# Patient Record
Sex: Female | Born: 1960
Health system: Southern US, Community
[De-identification: ages and names within clinical notes are randomized; demographics above are authoritative.]

## PROBLEM LIST (undated history)

## (undated) DIAGNOSIS — F32A Depression, unspecified: Secondary | ICD-10-CM

## (undated) DIAGNOSIS — J45909 Unspecified asthma, uncomplicated: Secondary | ICD-10-CM

## (undated) DIAGNOSIS — F419 Anxiety disorder, unspecified: Secondary | ICD-10-CM

## (undated) DIAGNOSIS — E039 Hypothyroidism, unspecified: Secondary | ICD-10-CM

## (undated) DIAGNOSIS — E785 Hyperlipidemia, unspecified: Secondary | ICD-10-CM

## (undated) DIAGNOSIS — G43909 Migraine, unspecified, not intractable, without status migrainosus: Secondary | ICD-10-CM

## (undated) DIAGNOSIS — F329 Major depressive disorder, single episode, unspecified: Secondary | ICD-10-CM

## (undated) DIAGNOSIS — G5600 Carpal tunnel syndrome, unspecified upper limb: Secondary | ICD-10-CM

## (undated) HISTORY — PX: REPLACEMENT TOTAL KNEE: SUR1224

## (undated) HISTORY — DX: Carpal tunnel syndrome, unspecified upper limb: G56.00

## (undated) HISTORY — PX: KNEE ARTHROPLASTY: SHX992

## (undated) HISTORY — DX: Migraine, unspecified, not intractable, without status migrainosus: G43.909

## (undated) HISTORY — DX: Hypothyroidism, unspecified: E03.9

## (undated) HISTORY — DX: Major depressive disorder, single episode, unspecified: F32.9

## (undated) HISTORY — DX: Unspecified asthma, uncomplicated: J45.909

## (undated) HISTORY — DX: Anxiety disorder, unspecified: F41.9

## (undated) HISTORY — DX: Depression, unspecified: F32.A

## (undated) HISTORY — DX: Hyperlipidemia, unspecified: E78.5

---

## 1991-09-03 HISTORY — PX: TONSILECTOMY/ADENOIDECTOMY WITH MYRINGOTOMY: SHX6125

## 1992-09-05 HISTORY — PX: SPINAL CORD DECOMPRESSION: SHX97

## 1999-02-27 ENCOUNTER — Other Ambulatory Visit: Admission: RE | Admit: 1999-02-27 | Discharge: 1999-02-27 | Payer: Self-pay | Admitting: *Deleted

## 1999-03-16 ENCOUNTER — Ambulatory Visit (HOSPITAL_COMMUNITY): Admission: RE | Admit: 1999-03-16 | Discharge: 1999-03-16 | Payer: Self-pay | Admitting: *Deleted

## 1999-03-16 ENCOUNTER — Encounter: Payer: Self-pay | Admitting: *Deleted

## 2000-04-16 ENCOUNTER — Other Ambulatory Visit: Admission: RE | Admit: 2000-04-16 | Discharge: 2000-04-16 | Payer: Self-pay | Admitting: *Deleted

## 2000-12-05 ENCOUNTER — Encounter: Admission: RE | Admit: 2000-12-05 | Discharge: 2000-12-05 | Payer: Self-pay | Admitting: Otolaryngology

## 2000-12-05 ENCOUNTER — Encounter: Payer: Self-pay | Admitting: Otolaryngology

## 2001-07-27 ENCOUNTER — Encounter: Payer: Self-pay | Admitting: Family Medicine

## 2001-07-27 ENCOUNTER — Ambulatory Visit (HOSPITAL_COMMUNITY): Admission: RE | Admit: 2001-07-27 | Discharge: 2001-07-27 | Payer: Self-pay | Admitting: Family Medicine

## 2001-10-23 ENCOUNTER — Other Ambulatory Visit: Admission: RE | Admit: 2001-10-23 | Discharge: 2001-10-23 | Payer: Self-pay | Admitting: *Deleted

## 2002-11-04 ENCOUNTER — Other Ambulatory Visit: Admission: RE | Admit: 2002-11-04 | Discharge: 2002-11-04 | Payer: Self-pay | Admitting: Obstetrics & Gynecology

## 2003-06-03 ENCOUNTER — Ambulatory Visit (HOSPITAL_COMMUNITY): Admission: RE | Admit: 2003-06-03 | Discharge: 2003-06-03 | Payer: Self-pay | Admitting: Obstetrics & Gynecology

## 2003-06-03 ENCOUNTER — Encounter: Payer: Self-pay | Admitting: Obstetrics & Gynecology

## 2004-06-21 ENCOUNTER — Ambulatory Visit (HOSPITAL_COMMUNITY): Admission: RE | Admit: 2004-06-21 | Discharge: 2004-06-21 | Payer: Self-pay | Admitting: Obstetrics & Gynecology

## 2006-07-02 ENCOUNTER — Ambulatory Visit (HOSPITAL_COMMUNITY): Admission: RE | Admit: 2006-07-02 | Discharge: 2006-07-02 | Payer: Self-pay | Admitting: Obstetrics & Gynecology

## 2007-07-10 ENCOUNTER — Ambulatory Visit (HOSPITAL_COMMUNITY): Admission: RE | Admit: 2007-07-10 | Discharge: 2007-07-10 | Payer: Self-pay | Admitting: Obstetrics & Gynecology

## 2008-07-15 ENCOUNTER — Ambulatory Visit (HOSPITAL_COMMUNITY): Admission: RE | Admit: 2008-07-15 | Discharge: 2008-07-15 | Payer: Self-pay | Admitting: Obstetrics & Gynecology

## 2010-07-12 ENCOUNTER — Ambulatory Visit (HOSPITAL_COMMUNITY): Admission: RE | Admit: 2010-07-12 | Discharge: 2010-07-12 | Payer: Self-pay | Admitting: Obstetrics & Gynecology

## 2013-04-12 ENCOUNTER — Other Ambulatory Visit (HOSPITAL_COMMUNITY): Payer: Self-pay | Admitting: Obstetrics & Gynecology

## 2013-04-12 DIAGNOSIS — Z1239 Encounter for other screening for malignant neoplasm of breast: Secondary | ICD-10-CM

## 2013-04-16 ENCOUNTER — Ambulatory Visit (HOSPITAL_COMMUNITY): Payer: BC Managed Care – PPO

## 2013-04-21 ENCOUNTER — Ambulatory Visit (HOSPITAL_COMMUNITY)
Admission: RE | Admit: 2013-04-21 | Discharge: 2013-04-21 | Disposition: A | Discharge status: Home / Self Care | Payer: BC Managed Care – PPO | Source: Ambulatory Visit | Attending: Obstetrics & Gynecology | Admitting: Obstetrics & Gynecology

## 2013-04-21 DIAGNOSIS — Z1239 Encounter for other screening for malignant neoplasm of breast: Secondary | ICD-10-CM

## 2013-04-21 DIAGNOSIS — Z1231 Encounter for screening mammogram for malignant neoplasm of breast: Secondary | ICD-10-CM | POA: Insufficient documentation

## 2013-04-27 ENCOUNTER — Other Ambulatory Visit: Payer: Self-pay | Admitting: Obstetrics & Gynecology

## 2013-04-27 DIAGNOSIS — R928 Other abnormal and inconclusive findings on diagnostic imaging of breast: Secondary | ICD-10-CM

## 2013-05-05 ENCOUNTER — Other Ambulatory Visit: Payer: Self-pay | Admitting: Family Medicine

## 2013-05-05 ENCOUNTER — Ambulatory Visit
Admission: RE | Admit: 2013-05-05 | Discharge: 2013-05-05 | Disposition: A | Discharge status: Home / Self Care | Payer: BC Managed Care – PPO | Source: Ambulatory Visit | Attending: Obstetrics & Gynecology | Admitting: Obstetrics & Gynecology

## 2013-05-05 DIAGNOSIS — R928 Other abnormal and inconclusive findings on diagnostic imaging of breast: Secondary | ICD-10-CM

## 2013-05-12 ENCOUNTER — Other Ambulatory Visit: Payer: BC Managed Care – PPO

## 2013-10-01 ENCOUNTER — Other Ambulatory Visit: Payer: Self-pay | Admitting: Family Medicine

## 2013-10-01 DIAGNOSIS — N63 Unspecified lump in unspecified breast: Secondary | ICD-10-CM

## 2013-11-03 ENCOUNTER — Ambulatory Visit
Admission: RE | Admit: 2013-11-03 | Discharge: 2013-11-03 | Disposition: A | Discharge status: Home / Self Care | Payer: Private Health Insurance - Indemnity | Source: Ambulatory Visit | Attending: Family Medicine | Admitting: Family Medicine

## 2013-11-03 DIAGNOSIS — N63 Unspecified lump in unspecified breast: Secondary | ICD-10-CM

## 2014-04-21 ENCOUNTER — Encounter: Payer: Self-pay | Admitting: Endocrinology

## 2014-04-21 ENCOUNTER — Ambulatory Visit (INDEPENDENT_AMBULATORY_CARE_PROVIDER_SITE_OTHER): Payer: Managed Care, Other (non HMO) | Admitting: Endocrinology

## 2014-04-21 VITALS — BP 140/86 | HR 76 | Temp 97.3°F | Resp 16 | Ht 63.0 in | Wt 267.0 lb

## 2014-04-21 DIAGNOSIS — R5381 Other malaise: Secondary | ICD-10-CM | POA: Insufficient documentation

## 2014-04-21 DIAGNOSIS — E038 Other specified hypothyroidism: Secondary | ICD-10-CM | POA: Insufficient documentation

## 2014-04-21 DIAGNOSIS — E041 Nontoxic single thyroid nodule: Secondary | ICD-10-CM | POA: Insufficient documentation

## 2014-04-21 DIAGNOSIS — N911 Secondary amenorrhea: Secondary | ICD-10-CM

## 2014-04-21 DIAGNOSIS — N912 Amenorrhea, unspecified: Secondary | ICD-10-CM

## 2014-04-21 DIAGNOSIS — R5383 Other fatigue: Secondary | ICD-10-CM

## 2014-04-21 LAB — CORTISOL: CORTISOL PLASMA: 8.5 ug/dL

## 2014-04-21 LAB — FOLLICLE STIMULATING HORMONE: FSH: 69.6 m[IU]/mL

## 2014-04-21 NOTE — Progress Notes (Signed)
Patient ID: Joyce Watts, female   DOB: 09/28/1960, 53 y.o.   MRN: [id number]   Reason for Appointment:  ? Hypothyroidism, new visit    History of Present Illness:   Over the last 3 months or so the patient is complaining about increasing fatigue. Although she wakes up feeling tired in the morning she tends to stay tired during the day; she is able to do her activities although she feels like she could use a nap in the afternoon. Does not have any weakness in her limbs Occasionally may have difficulty concentrating and has recent  difficulties with memory also She has been losing some hair over the last year mostly on the sides. She thinks her nails are somewhat more fragile and thin, no dry skin or constipation She used to be cold sensitive and now is getting regarding hot sensations Recently has not gained any weight but over the last couple of years has gained 20 pounds  She was apparently evaluated in 2010 by an endocrinologist for similar problems but no details are available She was not given any treatment at that time  Recently had labs checked by her PCP and was found to have abnormal results as follows  On 03/30/14 TSH was 0.21 and free T4 was 0.59          Past Medical History  Diagnosis Date  . Depression   . Anxiety   . Migraines     Past history  . Carpal tunnel syndrome   . Hyperlipidemia     LDL in 6/15 = 133    No past surgical history on file.  Family History  Problem Relation Age of Onset  . Hypertension Father   . Diabetes Father   . Diabetes Maternal Grandfather   . Diabetes Paternal Grandfather   . Thyroid disease Neg Hx     Social History:  reports that she has never smoked. She has never used smokeless tobacco. Her alcohol and drug histories are not on file.  Allergies:  Allergies  Allergen Reactions  . Sulfa Antibiotics Rash      Medication List       This list is accurate as of: 04/21/14  5:14 PM.  Always use your most recent med list.                ALPRAZolam 0.5 MG tablet  Commonly known as:  XANAX  as needed.     buPROPion 300 MG 24 hr tablet  Commonly known as:  WELLBUTRIN XL     hydrochlorothiazide 25 MG tablet  Commonly known as:  HYDRODIURIL  as needed.     mupirocin cream 2 %  Commonly known as:  BACTROBAN     VENTOLIN HFA 108 (90 BASE) MCG/ACT inhaler  Generic drug:  albuterol  as needed.        Review of Systems:  She is postmenopausal. Has not had any menstrual cycles for about 2 years. Initially had premenopausal hot flashes. For the last 6 months has had periodic sweating.  CARDIOLOGY: no history of high blood pressure.   Edema present in the past, treated with taking HCTZ off and on          GASTROENTEROLOGY:  no Change in bowel habits, no abdominal pain     ENDOCRINOLOGY:  no history of Diabetes.           Has had anxiety for 1.5-2 years and also some depression treated with Xanax and Wellbutrin  Still complains about  carpal tunnel syndrome in her hand   Examination:    BP 140/86  Pulse 76  Temp(Src) 97.3 F (36.3 C)  Resp 16  Ht 5\' 3"  (1.6 m)  Wt 267 lb (121.11 kg)  BMI 47.31 kg/m2  SpO2 98%   General Appearance: pleasant, has generalized obesity, no cushingoid features         Eyes: No prominence or swelling of eyes. Fundi showed normal optic discs. Visual fields normal by confrontation           Neck: The thyroid is palpable on the left side, has a smooth firm 2-3 cm nodule felt on swallowing, best felt in the supine position. Right lobe not palpable There is no lymphadenopathy in the neck.   ENT: Tongue normal, no oral pigmentation Cardiovascular: Normal  heart sounds, no murmur Respiratory:  Lungs clear Gastrointestinal: abdomen soft, no hepatosplenomegaly present Neurological: REFLEXES: at biceps are normal to brisk, ankle reflexes difficult to elicit.     Skin: No excessive dryness, no skin rash or pigmentation Extremities: No edema present. Joints and nails appear  normal  Assessments/Plan   1. Probable Secondary Hypothyroidism with low free T4 level and symptoms of fatigue, weight gain, memory difficulties and some hair loss She does have other confounding issues that can cause fatigue including her sleep disturbance and anxiety/depression However her low normal free T4 and TSH indicate likely secondary hypothyroidism For this she will need to have evaluation of the pituitary hormones and determine if this is an isolated problem or more generalized which would indicate a structural issue with the pituitary gland  If her pituitary is otherwise normal will have her empirically try thyroid supplement starting with 50 mcg levothyroxine. She will followup in 2 months  2. Left thyroid nodule. This will need further evaluation since this is a new finding.  Her low TSH may be a reflection of an autonomous thyroid which may need to be evaluated with a thyroid scan. To start with we will get her evaluated with a thyroid ultrasound Discussed that 95% of thyroid nodules are benign but this may need tissue diagnosis   Joyce Watts 04/21/2014, 5:14 PM

## 2014-04-22 LAB — PROLACTIN: PROLACTIN: 9 ng/mL

## 2014-04-22 LAB — INSULIN-LIKE GROWTH FACTOR: Somatomedin (IGF-I): 119 ng/mL (ref 49–220)

## 2014-04-23 NOTE — Progress Notes (Signed)
Quick Note:  Please let patient know that the lab results are all normal and start levothyroxine 50 mcg daily for thyroid, followup as directed ______

## 2014-04-26 ENCOUNTER — Other Ambulatory Visit: Payer: Self-pay | Admitting: *Deleted

## 2014-04-26 ENCOUNTER — Ambulatory Visit
Admission: RE | Admit: 2014-04-26 | Discharge: 2014-04-26 | Disposition: A | Discharge status: Home / Self Care | Payer: Private Health Insurance - Indemnity | Source: Ambulatory Visit | Attending: Endocrinology | Admitting: Endocrinology

## 2014-04-26 DIAGNOSIS — E041 Nontoxic single thyroid nodule: Secondary | ICD-10-CM

## 2014-04-26 MED ORDER — LEVOTHYROXINE SODIUM 50 MCG PO TABS: 50.0000 ug | ORAL_TABLET | Freq: Every day | ORAL | Status: DC

## 2014-04-28 ENCOUNTER — Other Ambulatory Visit: Payer: Self-pay | Admitting: Endocrinology

## 2014-04-28 DIAGNOSIS — E041 Nontoxic single thyroid nodule: Secondary | ICD-10-CM

## 2014-05-10 ENCOUNTER — Ambulatory Visit
Admission: RE | Admit: 2014-05-10 | Discharge: 2014-05-10 | Disposition: A | Discharge status: Home / Self Care | Payer: Private Health Insurance - Indemnity | Source: Ambulatory Visit | Attending: Endocrinology | Admitting: Endocrinology

## 2014-05-10 ENCOUNTER — Other Ambulatory Visit (HOSPITAL_COMMUNITY)
Admission: RE | Admit: 2014-05-10 | Discharge: 2014-05-10 | Disposition: A | Discharge status: Home / Self Care | Payer: Managed Care, Other (non HMO) | Source: Ambulatory Visit | Attending: Interventional Radiology | Admitting: Interventional Radiology

## 2014-05-10 DIAGNOSIS — E041 Nontoxic single thyroid nodule: Secondary | ICD-10-CM | POA: Insufficient documentation

## 2014-05-12 ENCOUNTER — Telehealth: Payer: Self-pay

## 2014-05-12 NOTE — Telephone Encounter (Signed)
Pt called concerning her thyroid Bx result.   Please advise pt, Thanks!

## 2014-06-13 ENCOUNTER — Other Ambulatory Visit: Payer: Self-pay | Admitting: *Deleted

## 2014-06-13 ENCOUNTER — Other Ambulatory Visit (INDEPENDENT_AMBULATORY_CARE_PROVIDER_SITE_OTHER): Payer: Managed Care, Other (non HMO)

## 2014-06-13 ENCOUNTER — Other Ambulatory Visit: Payer: Managed Care, Other (non HMO)

## 2014-06-13 DIAGNOSIS — E038 Other specified hypothyroidism: Secondary | ICD-10-CM

## 2014-06-13 LAB — TSH: TSH: 0.04 u[IU]/mL — ABNORMAL LOW (ref 0.35–4.50)

## 2014-06-13 LAB — T4, FREE: Free T4: 1 ng/dL (ref 0.60–1.60)

## 2014-06-14 ENCOUNTER — Other Ambulatory Visit: Payer: Self-pay | Admitting: Family Medicine

## 2014-06-14 DIAGNOSIS — N63 Unspecified lump in unspecified breast: Secondary | ICD-10-CM

## 2014-06-16 ENCOUNTER — Ambulatory Visit (INDEPENDENT_AMBULATORY_CARE_PROVIDER_SITE_OTHER): Payer: Private Health Insurance - Indemnity | Admitting: Endocrinology

## 2014-06-16 ENCOUNTER — Encounter: Payer: Self-pay | Admitting: Endocrinology

## 2014-06-16 VITALS — BP 118/82 | HR 100 | Temp 98.2°F | Resp 16 | Ht 63.0 in | Wt 264.2 lb

## 2014-06-16 DIAGNOSIS — E041 Nontoxic single thyroid nodule: Secondary | ICD-10-CM

## 2014-06-16 DIAGNOSIS — E038 Other specified hypothyroidism: Secondary | ICD-10-CM

## 2014-06-16 DIAGNOSIS — Z23 Encounter for immunization: Secondary | ICD-10-CM

## 2014-06-16 NOTE — Patient Instructions (Signed)
Same dose 

## 2014-06-16 NOTE — Progress Notes (Signed)
Patient ID: Joyce Watts, female   DOB: 07/17/1961, 53 y.o.   MRN: [id number]   Reason for Appointment:  ? Hypothyroidism, new visit    History of Present Illness:   She was seen in evaluation for secondary hypothyroidism in 04/2014 At that time she was having symptoms of fatigue, weight gain, memory difficulties and some hair loss; symptoms had started in 5/15 Also had some nail changes Occasionally has had difficulty concentrating and has some  difficulties with memory  She used to be cold sensitive and now is getting regarding hot sensations  She was found to have a low free T4 of 0.59 by her PCP and referred here for further management. TSH has been normal  She was evaluated for pituitary hypofunction and all the labs were perfectly normal including FSH She was started on levothyroxine 50 mcg daily Since then patient has started to improve with her energy level and also thinks that her fingernail and hair changes are getting better Does not complain of much difficulty with concentration or memory Also her weight has improved 3 pounds Her labs are back to normal with free T4  Lab Results  Component Value Date   FREET4 1.00 06/13/2014   TSH 0.04* 06/13/2014      Wt Readings from Last 3 Encounters:  06/16/14 264 lb 3.2 oz (119.84 kg)  04/21/14 267 lb (121.11 kg)    PROBLEM 2: Left thyroid nodule.   On her exam she was found to have a thyroid nodule on the left side and an ultrasound this was 4.8 cm She does occasionally have difficulty swallowing liquids for several months but this is only minor problem. No choking Thyroid biopsy showed benign follicular cells    Past Medical History  Diagnosis Date  . Depression   . Anxiety   . Migraines     Past history  . Carpal tunnel syndrome   . Hyperlipidemia     LDL in 6/15 = 133    No past surgical history on file.  Family History  Problem Relation Age of Onset  . Hypertension Father   . Diabetes Father   .  Diabetes Maternal Grandfather   . Diabetes Paternal Grandfather   . Thyroid disease Neg Hx     Social History:  reports that she has never smoked. She has never used smokeless tobacco. Her alcohol and drug histories are not on file.  Allergies:  Allergies  Allergen Reactions  . Sulfa Antibiotics Rash      Medication List       This list is accurate as of: 06/16/14  4:59 PM.  Always use your most recent med list.               ALPRAZolam 0.5 MG tablet  Commonly known as:  XANAX  as needed.     buPROPion 300 MG 24 hr tablet  Commonly known as:  WELLBUTRIN XL     hydrochlorothiazide 25 MG tablet  Commonly known as:  HYDRODIURIL  as needed.     levothyroxine 50 MCG tablet  Commonly known as:  SYNTHROID, LEVOTHROID  Take 1 tablet (50 mcg total) by mouth daily.     mupirocin cream 2 %  Commonly known as:  BACTROBAN     VENTOLIN HFA 108 (90 BASE) MCG/ACT inhaler  Generic drug:  albuterol  as needed.        Review of Systems:  She is postmenopausal. Has not had any menstrual cycles for about 2 years. Initially  had premenopausal hot flashes.  For the last 6 months has had periodic sweating.   Edema present in the past, treated with taking HCTZ off and on          ENDOCRINOLOGY:  no history of Diabetes.           Has had anxiety for 1.5-2 years and also some depression treated with Xanax and Wellbutrin  Has carpal tunnel syndrome in her hand   Examination:    BP 118/82  Pulse 100  Temp(Src) 98.2 F (36.8 C)  Resp 16  Ht 5\' 3"  (1.6 m)  Wt 264 lb 3.2 oz (119.84 kg)  BMI 46.81 kg/m2  SpO2 95%  Thyroid not palpable.  Biceps reflexes appear normal  Assessments/Plan   1.  Secondary Hypothyroidism with low free T4 level and symptoms of fatigue, weight gain, memory difficulties and  hair loss She is subjectively starting to feel better with levothyroxine supplementation and her free T4 is good at 1.0 No evidence of pituitary hypofunction otherwise She  will continue same dose for now   2. Left thyroid nodule. This is benign on biopsy and will follow clinically Her TSH is relatively low probably reflecting pituitary hypofunction and effect of thyroid supplementation recently   [facility],Joyce Mcgriff 06/16/2014, 4:59 PM

## 2014-06-21 ENCOUNTER — Ambulatory Visit
Admission: RE | Admit: 2014-06-21 | Discharge: 2014-06-21 | Disposition: A | Discharge status: Home / Self Care | Payer: Private Health Insurance - Indemnity | Source: Ambulatory Visit | Attending: Family Medicine | Admitting: Family Medicine

## 2014-06-21 ENCOUNTER — Encounter (INDEPENDENT_AMBULATORY_CARE_PROVIDER_SITE_OTHER): Payer: Self-pay

## 2014-06-21 DIAGNOSIS — N63 Unspecified lump in unspecified breast: Secondary | ICD-10-CM

## 2014-10-19 ENCOUNTER — Ambulatory Visit: Payer: Managed Care, Other (non HMO) | Admitting: Endocrinology

## 2014-10-26 ENCOUNTER — Other Ambulatory Visit: Payer: Self-pay | Admitting: Endocrinology

## 2014-11-07 ENCOUNTER — Encounter: Payer: Self-pay | Admitting: Endocrinology

## 2014-11-07 ENCOUNTER — Ambulatory Visit (INDEPENDENT_AMBULATORY_CARE_PROVIDER_SITE_OTHER): Payer: BLUE CROSS/BLUE SHIELD | Admitting: Endocrinology

## 2014-11-07 VITALS — BP 143/93 | HR 83 | Temp 97.6°F | Resp 14 | Ht 63.0 in | Wt 271.0 lb

## 2014-11-07 DIAGNOSIS — E038 Other specified hypothyroidism: Secondary | ICD-10-CM | POA: Diagnosis not present

## 2014-11-07 DIAGNOSIS — E041 Nontoxic single thyroid nodule: Secondary | ICD-10-CM

## 2014-11-07 NOTE — Progress Notes (Signed)
Patient ID: Joyce BurnLora A Blew, female   DOB: 09/14/1960, 54 y.o.   MRN: [id number]   Reason for Appointment:  ? Hypothyroidism, new visit    History of Present Illness:   She was seen in evaluation for secondary hypothyroidism in 04/2014 At that time she was having symptoms of fatigue, weight gain, memory difficulties and some hair loss; symptoms had started in 5/15 Also had some nail changes She was found to have a low free T4 of 0.59 by her PCP and referred here for further management. TSH previously was normal She was evaluated for pituitary hypofunction and all the labs were normal including appropriate level of  FSH  She was started on levothyroxine 50 mcg daily Since then patient had improved her energy level as well as less difficulties with concentration and memory. She thinks she is still having some fingernail and hair changes  He does not have cord intolerance, tends to get hot flashes because of her menopause  Her dose was continued at 50 g in 10/15 and now she is here for follow-up.  No recent labs available   Lab Results  Component Value Date   FREET4 1.00 06/13/2014   TSH 0.04* 06/13/2014      Wt Readings from Last 3 Encounters:  11/07/14 271 lb (122.925 kg)  06/16/14 264 lb 3.2 oz (119.84 kg)  04/21/14 267 lb (121.11 kg)    PROBLEM 2:  Left thyroid nodule.   On her  initial exam she was found to have a thyroid nodule on the left side and an ultrasound this was 4.8 cm She does occasionally have difficulty swallowing liquids for several months but this is not any worse. No choking Thyroid biopsy showed benign follicular cells    Past Medical History  Diagnosis Date  . Depression   . Anxiety   . Migraines     Past history  . Carpal tunnel syndrome   . Hyperlipidemia     LDL in 6/15 = 133    No past surgical history on file.  Family History  Problem Relation Age of Onset  . Hypertension Father   . Diabetes Father   . Diabetes Maternal Grandfather    . Diabetes Paternal Grandfather   . Thyroid disease Neg Hx     Social History:  reports that she has never smoked. She has never used smokeless tobacco. Her alcohol and drug histories are not on file.  Allergies:  Allergies  Allergen Reactions  . Sulfa Antibiotics Rash      Medication List       This list is accurate as of: 11/07/14  4:02 PM.  Always use your most recent med list.               ALPRAZolam 0.5 MG tablet  Commonly known as:  XANAX  as needed.     buPROPion 300 MG 24 hr tablet  Commonly known as:  WELLBUTRIN XL     hydrochlorothiazide 25 MG tablet  Commonly known as:  HYDRODIURIL  as needed.     levothyroxine 50 MCG tablet  Commonly known as:  SYNTHROID, LEVOTHROID  TAKE 1 TABLET BY MOUTH ONCE DAILY     mupirocin cream 2 %  Commonly known as:  BACTROBAN     VENTOLIN HFA 108 (90 BASE) MCG/ACT inhaler  Generic drug:  albuterol  as needed.        Review of Systems:  She is postmenopausal. Has not had any menstrual cycles for about 2 years.  She still has hot flashes  along with periodic sweating. Her gynecologist was not keen on giving her HRT   Edema present in the past, treated with taking HCTZ off and on                  Examination:    BP 143/93 mmHg  Pulse 83  Temp(Src) 97.6 F (36.4 C)  Resp 14  Ht  (1.6 m)  Wt 271 lb (122.925 kg)  BMI 48.02 kg/m2  SpO2 96%  Thyroid exam shows firm rounded nodule felt in the left medial lower lobe with difficulty.  It is estimated to be approximately 3 cm in size  Biceps reflexes appear normal  Assessments/Plan   1.  Secondary Hypothyroidism with low free T4 level and symptoms of fatigue, weight gain, memory difficulties and  hair loss She is subjectively starting to feel better with levothyroxine supplementation and her free T4 is good at 1.0 No evidence of pituitary hypofunction otherwise She will continue same dose for now   2. Left thyroid nodule. This is benign on biopsy and  will follow, will recheck ultrasound on the next visit to reassess the size  Her TSH is relatively low probably reflecting pituitary hypofunction and effect of thyroid supplementation  3.  Menopause: Since she is symptomatic she could take HRT for short addition of time.  She will discuss this with her gynecologist next month   [facility] 11/07/2014, 4:02 PM   Addendum: Free T4 about the same, to continue same dose

## 2014-11-08 LAB — TSH: TSH: 0.05 u[IU]/mL — AB (ref 0.35–4.50)

## 2014-11-08 LAB — T4, FREE: Free T4: 0.98 ng/dL (ref 0.60–1.60)

## 2014-11-28 ENCOUNTER — Other Ambulatory Visit: Payer: Self-pay | Admitting: Endocrinology

## 2014-12-25 IMAGING — MG MM DIGITAL SCREENING 3D TOMO
4 series · 4 of 20 positions shown · non-contrast
Comparison: Previous exam(s).

CLINICAL DATA: Screening.

DIGITAL BREAST TOMOSYNTHESIS
 DIGITAL BREAST TOMOSYNTHESIS
Digital breast tomosynthesis images are acquired in two
projections.  These images are reviewed in combination with the
digital mammogram, confirming the findings below.

[R MLO tomo · tomo slice 48/95.0]
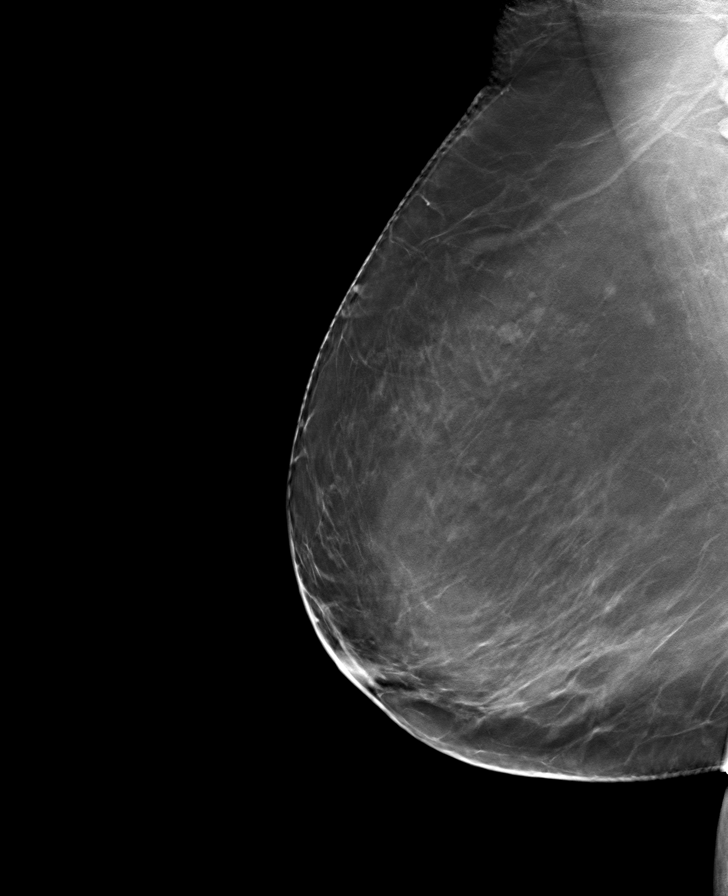

[R CC tomo · tomo slice 41/80.0]
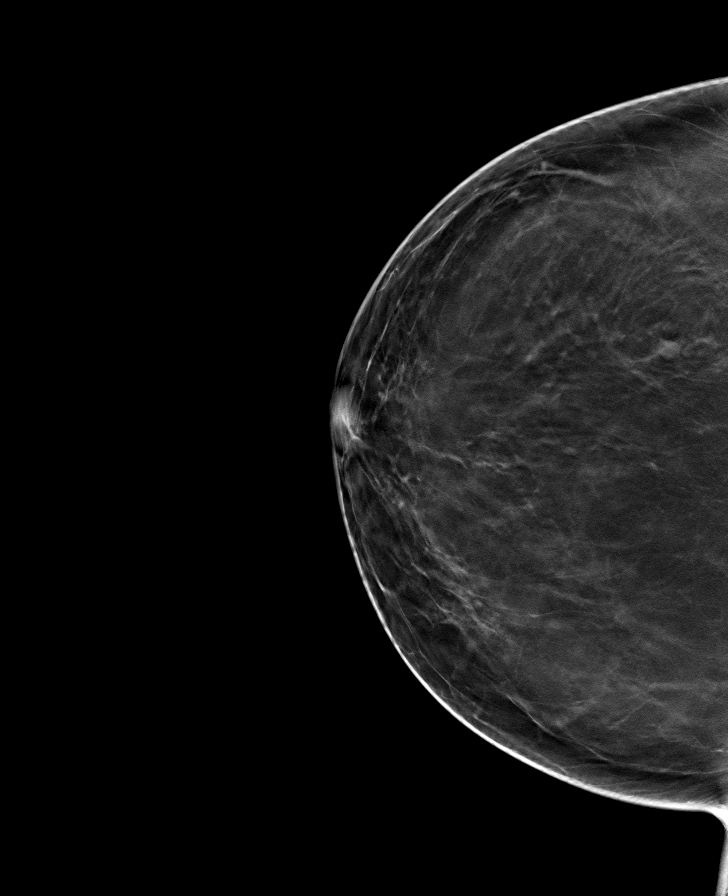

[L MLO tomo · tomo slice 47/94.0]
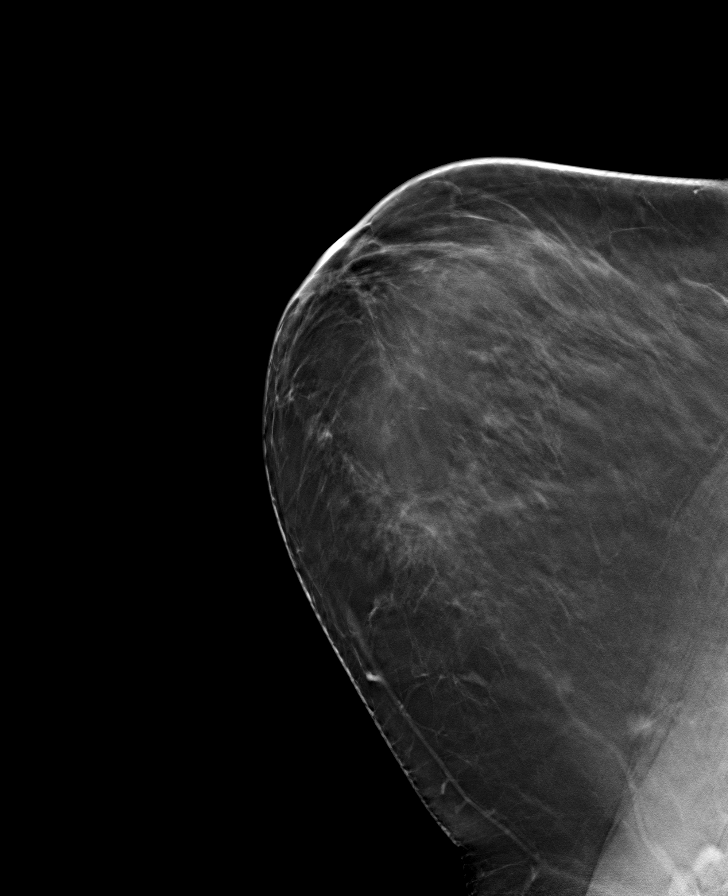

[L CC tomo · tomo slice 42/83.0]
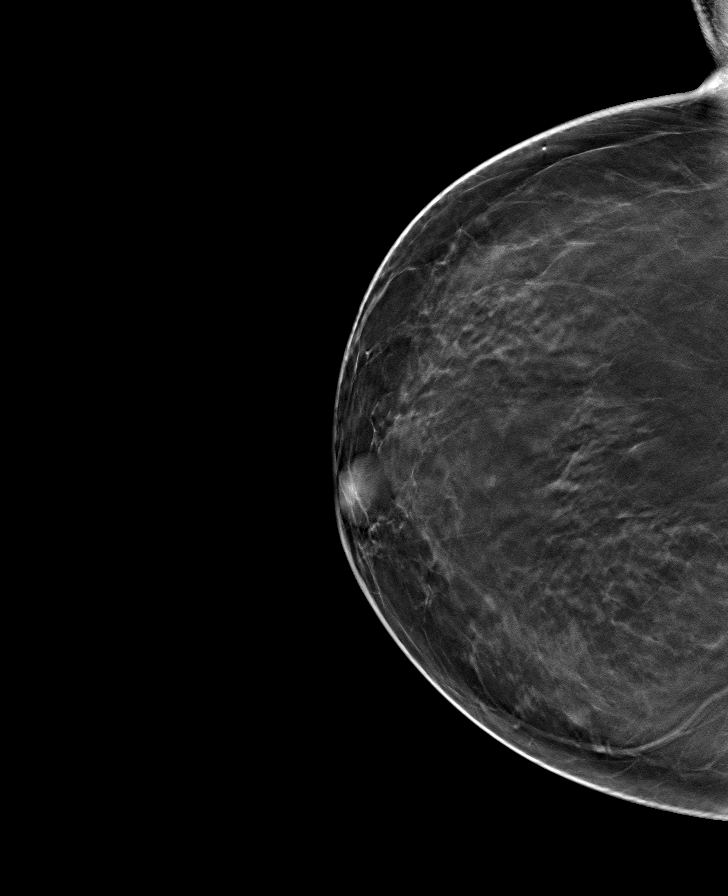

[4 of 20 positions shown; findings below may reference images not displayed]

FINDINGS: ACR Breast Density Category b:  There are scattered areas of
fibroglandular density. In the right breast, a possible mass
warrants further evaluation with spot compression views and
possibly ultrasound.  In the left breast, there are no findings
suspicious for malignancy.
IMPRESSION: Further evaluation is suggested for possible mass in the right
breast.

RECOMMENDATION:
Diagnostic mammogram and possibly ultrasound of the right breast.
(Code:[id number]-[id number])

The patient will be contacted regarding the findings, and
additional imaging will be scheduled.

BI-RADS CATEGORY 0:  Incomplete.  Need additional imaging
evaluation and/or prior mammograms for comparison.

## 2015-05-05 ENCOUNTER — Other Ambulatory Visit: Payer: Private Health Insurance - Indemnity

## 2015-05-10 ENCOUNTER — Ambulatory Visit: Payer: Private Health Insurance - Indemnity | Admitting: Endocrinology

## 2015-05-22 ENCOUNTER — Other Ambulatory Visit (INDEPENDENT_AMBULATORY_CARE_PROVIDER_SITE_OTHER): Payer: BLUE CROSS/BLUE SHIELD

## 2015-05-22 DIAGNOSIS — E038 Other specified hypothyroidism: Secondary | ICD-10-CM | POA: Diagnosis not present

## 2015-05-22 LAB — T4, FREE: Free T4: 0.98 ng/dL (ref 0.60–1.60)

## 2015-05-22 LAB — T3, FREE: T3 FREE: 3.7 pg/mL (ref 2.3–4.2)

## 2015-05-22 LAB — TSH: TSH: 0.06 u[IU]/mL — AB (ref 0.35–4.50)

## 2015-05-25 ENCOUNTER — Encounter: Payer: Self-pay | Admitting: Endocrinology

## 2015-05-25 ENCOUNTER — Ambulatory Visit (INDEPENDENT_AMBULATORY_CARE_PROVIDER_SITE_OTHER): Payer: BLUE CROSS/BLUE SHIELD | Admitting: Endocrinology

## 2015-05-25 VITALS — BP 124/82 | HR 88 | Temp 98.3°F | Resp 16 | Ht 63.0 in | Wt 280.4 lb

## 2015-05-25 DIAGNOSIS — E041 Nontoxic single thyroid nodule: Secondary | ICD-10-CM | POA: Diagnosis not present

## 2015-05-25 DIAGNOSIS — E038 Other specified hypothyroidism: Secondary | ICD-10-CM

## 2015-05-25 NOTE — Progress Notes (Signed)
Patient ID: Joyce Watts, female   DOB: 01/17/1961, 54 y.o.   MRN: [id number]   Reason for Appointment:  Secondary Hypothyroidism, follow-up visit    History of Present Illness:   She was seen in evaluation for secondary hypothyroidism in 04/2014 At that time she was having symptoms of fatigue, weight gain, memory difficulties, brittle nails and some hair loss; symptoms had started in 5/15  She was found to have a low free T4 of 0.59 by her PCP and referred here for further management. TSH previously was normal She was evaluated for pituitary hypofunction and all the labs were normal including appropriate level of  FSH  She has been taking levothyroxine 50 mcg daily since 8/15 Since then patient had improved her energy level as well as less difficulties with concentration and memory. Recently has a little fatigue but she thinks this is from not sleeping well  Her dose was continued at 50 g on follow-up visit Free T4 has been consistently about the same in the normal range  Lab Results  Component Value Date   FREET4 0.98 05/22/2015   FREET4 0.98 11/07/2014   FREET4 1.00 06/13/2014   TSH 0.06* 05/22/2015   TSH 0.05* 11/07/2014   TSH 0.04* 06/13/2014      Wt Readings from Last 3 Encounters:  05/25/15 280 lb 6.4 oz (127.189 kg)  11/07/14 271 lb (122.925 kg)  06/16/14 264 lb 3.2 oz (119.84 kg)    PROBLEM 2:  Left thyroid nodule.   On her  initial exam she was found to have a thyroid nodule on the left side and an ultrasound this was 4.8 cm She does occasionally have difficulty swallowing liquids for several months but this is not significant. No choking Thyroid biopsy showed benign follicular cells    Past Medical History  Diagnosis Date  . Depression   . Anxiety   . Migraines     Past history  . Carpal tunnel syndrome   . Hyperlipidemia     LDL in 6/15 = 133    No past surgical history on file.  Family History  Problem Relation Age of Onset  . Hypertension  Father   . Diabetes Father   . Diabetes Maternal Grandfather   . Diabetes Paternal Grandfather   . Thyroid disease Neg Hx     Social History:  reports that she has never smoked. She has never used smokeless tobacco. Her alcohol and drug histories are not on file.  Allergies:  Allergies  Allergen Reactions  . Sulfa Antibiotics Rash      Medication List       This list is accurate as of: 05/25/15  5:11 PM.  Always use your most recent med list.               ALPRAZolam 0.5 MG tablet  Commonly known as:  XANAX  as needed.     buPROPion 300 MG 24 hr tablet  Commonly known as:  WELLBUTRIN XL     escitalopram 10 MG tablet  Commonly known as:  LEXAPRO     hydrochlorothiazide 25 MG tablet  Commonly known as:  HYDRODIURIL  as needed.     levothyroxine 50 MCG tablet  Commonly known as:  SYNTHROID, LEVOTHROID  TAKE 1 TABLET BY MOUTH ONCE DAILY     mupirocin cream 2 %  Commonly known as:  BACTROBAN     rizatriptan 10 MG tablet  Commonly known as:  MAXALT     VENTOLIN HFA 108 (  90 BASE) MCG/ACT inhaler  Generic drug:  albuterol  as needed.        Review of Systems:  She is postmenopausal. Has not had any menstrual cycles for about 2 years.   She still has hot flashes  along with periodic sweating. Her gynecologist was not keen on giving her HRT and the patient has not discussed this topic again   Edema present in the past, treated with taking HCTZ as needed               Examination:    BP 124/82 mmHg  Pulse 88  Temp(Src) 98.3 F (36.8 C)  Resp 16  Ht  (1.6 m)  Wt 280 lb 6.4 oz (127.189 kg)  BMI 49.68 kg/m2  SpO2 96%  Thyroid exam shows firm smooth nodule felt in the left medial lower lobe on swallowing .  It is estimated to be approximately 3 cm in size  Biceps reflexes appear normal Skin is normal No peripheral edema  Assessments/Plan   1.  Secondary Hypothyroidism with low free T4 level and symptoms of fatigue, weight gain, memory  difficulties and  hair loss She has done subjectively better with levothyroxine supplementation with more energy level and better mental function Again her free T4 is stable at about 1.0 No evidence of pituitary hypofunction otherwise She will continue same dose for now Her TSH is relatively low probably reflecting pituitary hypofunction and effect of thyroid supplementation   2. Left thyroid nodule. This is clinically about the same in size as before and has been benign on biopsy and will follow every 6 months on exam   3.  Menopause: Since she is symptomatic she could take HRT for a short time.  She will discuss this with her gynecologist    [facility] 05/25/2015, 5:11 PM

## 2015-07-17 ENCOUNTER — Other Ambulatory Visit: Payer: Self-pay | Admitting: Family Medicine

## 2015-07-17 DIAGNOSIS — N6310 Unspecified lump in the right breast, unspecified quadrant: Secondary | ICD-10-CM

## 2015-07-18 ENCOUNTER — Other Ambulatory Visit: Payer: Self-pay | Admitting: Endocrinology

## 2015-07-24 ENCOUNTER — Ambulatory Visit
Admission: RE | Admit: 2015-07-24 | Discharge: 2015-07-24 | Disposition: A | Discharge status: Home / Self Care | Payer: BLUE CROSS/BLUE SHIELD | Source: Ambulatory Visit | Attending: Family Medicine | Admitting: Family Medicine

## 2015-07-24 ENCOUNTER — Other Ambulatory Visit: Payer: Self-pay | Admitting: Family Medicine

## 2015-07-24 DIAGNOSIS — N6310 Unspecified lump in the right breast, unspecified quadrant: Secondary | ICD-10-CM

## 2015-09-22 MED FILL — LEVOTHYROXINE 50 MCG TABLET: 50 | 30 days supply | Qty: 30 | Fill #1

## 2015-09-22 MED FILL — ESCITALOPRAM 10 MG TABLET: 10 | 30 days supply | Qty: 30 | Fill #2

## 2015-10-03 MED FILL — BUPROPION HCL XL 300 MG TAB: 300 | 30 days supply | Qty: 30 | Fill #5

## 2015-10-25 MED FILL — ESCITALOPRAM 10 MG TABLET: 10 | 30 days supply | Qty: 30 | Fill #3

## 2015-10-25 MED FILL — HYDROCHLOROTHIAZIDE 25 MG T: 25 | 30 days supply | Qty: 30 | Fill #2

## 2015-10-27 MED FILL — BUPROPION HCL XL 300 MG TAB: 300 | 30 days supply | Qty: 30 | Fill #0

## 2015-11-17 ENCOUNTER — Other Ambulatory Visit (INDEPENDENT_AMBULATORY_CARE_PROVIDER_SITE_OTHER): Payer: BLUE CROSS/BLUE SHIELD

## 2015-11-17 DIAGNOSIS — E038 Other specified hypothyroidism: Secondary | ICD-10-CM

## 2015-11-17 LAB — T4, FREE: FREE T4: 0.86 ng/dL (ref 0.60–1.60)

## 2015-11-17 LAB — TSH: TSH: 0.04 u[IU]/mL — AB (ref 0.35–4.50)

## 2015-11-17 MED FILL — LEVOTHYROXINE 50 MCG TABLET: 50 | 30 days supply | Qty: 30 | Fill #2

## 2015-11-22 ENCOUNTER — Encounter: Payer: Self-pay | Admitting: Endocrinology

## 2015-11-22 ENCOUNTER — Ambulatory Visit (INDEPENDENT_AMBULATORY_CARE_PROVIDER_SITE_OTHER): Payer: BLUE CROSS/BLUE SHIELD | Admitting: Endocrinology

## 2015-11-22 VITALS — BP 124/82 | HR 103 | Temp 97.8°F | Resp 14 | Ht 63.0 in | Wt 272.8 lb

## 2015-11-22 DIAGNOSIS — E038 Other specified hypothyroidism: Secondary | ICD-10-CM | POA: Diagnosis not present

## 2015-11-22 NOTE — Patient Instructions (Signed)
Same harris

## 2015-11-22 NOTE — Progress Notes (Signed)
Patient ID: Joyce Watts, female   DOB: 05/23/1961, 55 y.o.   MRN: [id number]   Reason for Appointment:  Secondary Hypothyroidism, follow-up visit    History of Present Illness:   She was seen in evaluation for secondary hypothyroidism in 04/2014 At that time she was having symptoms of fatigue, weight gain, memory difficulties, brittle nails and some hair loss; symptoms had started in 5/15  She was found to have a low free T4 of 0.59 by her PCP and referred here for further management. TSH previously was normal She was evaluated for pituitary hypofunction and all the labs were normal including appropriate level of  FSH  She has been taking levothyroxine 50 mcg daily since 8/15 Since then patient had improved her energy level as well as less issues with concentration and memory. She thinks her hair is also better She still has some brittle nails  Again has a little fatigue but she thinks this is from not sleeping well  Her dose was continued at 50 g on the last follow-up visit Free T4 has come down slightly from her previous level of 0.98   Lab Results  Component Value Date   FREET4 0.86 11/17/2015   FREET4 0.98 05/22/2015   FREET4 0.98 11/07/2014   TSH 0.04* 11/17/2015   TSH 0.06* 05/22/2015   TSH 0.05* 11/07/2014      Wt Readings from Last 3 Encounters:  11/22/15 272 lb 12.8 oz (123.741 kg)  05/25/15 280 lb 6.4 oz (127.189 kg)  11/07/14 271 lb (122.925 kg)    PROBLEM 2:  Left thyroid nodule.   On her  initial exam she was found to have a thyroid nodule on the left side and an ultrasound this was 4.8 cm She does occasionally have difficulty swallowing liquids for several months but this is not significant.  No local pressure symptoms  Thyroid biopsy showed benign follicular cells    Past Medical History  Diagnosis Date  . Depression   . Anxiety   . Migraines     Past history  . Carpal tunnel syndrome   . Hyperlipidemia     LDL in 6/15 = 133     No past surgical history on file.  Family History  Problem Relation Age of Onset  . Hypertension Father   . Diabetes Father   . Diabetes Maternal Grandfather   . Diabetes Paternal Grandfather   . Thyroid disease Neg Hx     Social History:  reports that she has never smoked. She has never used smokeless tobacco. Her alcohol and drug histories are not on file.  Allergies:  Allergies  Allergen Reactions  . Sulfa Antibiotics Rash      Medication List       This list is accurate as of: 11/22/15  3:28 PM.  Always use your most recent med list.               ALPRAZolam 0.5 MG tablet  Commonly known as:  XANAX  as needed.     buPROPion 300 MG 24 hr tablet  Commonly known as:  WELLBUTRIN XL     escitalopram 10 MG tablet  Commonly known as:  LEXAPRO     hydrochlorothiazide 25 MG tablet  Commonly known as:  HYDRODIURIL  as needed.     levothyroxine 50 MCG tablet  Commonly known as:  SYNTHROID, LEVOTHROID  TAKE 1 TABLET BY MOUTH ONCE DAILY     mupirocin cream 2 %  Commonly known as:  BACTROBAN     rizatriptan 10 MG tablet  Commonly known as:  MAXALT     VENTOLIN HFA 108 (90 Base) MCG/ACT inhaler  Generic drug:  albuterol  as needed.        Review of Systems:  She is postmenopausal. Has not had any menstrual cycles for about 2 years.   She still has hot flashes along with periodic sweating. Her gynecologist was not keen on giving her HRT    Edema present in the past, treated with taking HCTZ as needed               Examination:    BP 124/82 mmHg  Pulse 103  Temp(Src) 97.8 F (36.6 C)  Resp 14  Ht  (1.6 m)  Wt 272 lb 12.8 oz (123.741 kg)  BMI 48.34 kg/m2  SpO2 96%  Thyroid exam shows firm smooth nodule felt in the left medial lower lobe on swallowing, Probably 3 cm in size  Biceps reflexes  normal Skin is normal  Assessments/Plan   1.  Secondary Hypothyroidism with low free T4 level and symptoms of fatigue, weight gain, memory  difficulties and  hair loss She has done subjectively well with levothyroxine supplementation with more energy level and better mental function Again her free T4 is normal although slightly lower than previous level However subjectively she does not feel any worse  She will continue same dose for now   2. Left thyroid nodule. This is clinically about the same in size as before and has been benign on biopsy and will follow every 6 months on exam     [facility] 11/22/2015, 3:28 PM

## 2015-12-08 MED FILL — BUPROPION HCL XL 300 MG TAB: 300 | 30 days supply | Qty: 30 | Fill #1

## 2015-12-11 MED FILL — HYDROCHLOROTHIAZIDE 25 MG T: 25 | 30 days supply | Qty: 30 | Fill #3

## 2016-01-04 MED FILL — LEVOTHYROXINE 50 MCG TABLET: 50 | 30 days supply | Qty: 30 | Fill #3

## 2016-01-04 MED FILL — BUPROPION HCL XL 300 MG TAB: 300 | 30 days supply | Qty: 30 | Fill #2

## 2016-01-23 MED FILL — HYDROCHLOROTHIAZIDE 25 MG T: 25 | 30 days supply | Qty: 30 | Fill #0

## 2016-02-06 MED FILL — BUPROPION HCL XL 300 MG TAB: 300 | 30 days supply | Qty: 30 | Fill #0

## 2016-02-21 MED FILL — ESCITALOPRAM 10 MG TABLET: 10 | 30 days supply | Qty: 30 | Fill #0

## 2016-02-21 MED FILL — LEVOTHYROXINE 50 MCG TABLET: 50 | 30 days supply | Qty: 30 | Fill #4

## 2016-02-22 MED FILL — ALPRAZolam 0.5 MG TABS: 0.5 | 20 days supply | Qty: 60 | Fill #0

## 2016-02-23 MED FILL — NEO/POLYMYXIN/HC EAR SOLN: 3.5-10000-1 | 16 days supply | Qty: 10 | Fill #0

## 2016-02-23 MED FILL — NYSTATIN 100,000 UNIT/GM PO: 100000 | 20 days supply | Qty: 60 | Fill #0

## 2016-03-07 MED FILL — HYDROCHLOROTHIAZIDE 25 MG T: 25 | 30 days supply | Qty: 30 | Fill #0

## 2016-03-07 MED FILL — BUPROPION HCL XL 300 MG TAB: 300 | 30 days supply | Qty: 30 | Fill #0

## 2016-03-07 MED FILL — MUPIROCIN 2% CREAM: 2 | 10 days supply | Qty: 15 | Fill #0

## 2016-03-19 ENCOUNTER — Other Ambulatory Visit: Payer: Self-pay | Admitting: Family Medicine

## 2016-03-19 DIAGNOSIS — N63 Unspecified lump in unspecified breast: Secondary | ICD-10-CM

## 2016-03-27 ENCOUNTER — Ambulatory Visit
Admission: RE | Admit: 2016-03-27 | Discharge: 2016-03-27 | Disposition: A | Discharge status: Home / Self Care | Payer: BLUE CROSS/BLUE SHIELD | Source: Ambulatory Visit | Attending: Family Medicine | Admitting: Family Medicine

## 2016-03-27 DIAGNOSIS — N63 Unspecified lump in unspecified breast: Secondary | ICD-10-CM

## 2016-03-27 MED FILL — RIZATRIPTAN 10 MG TABLET: 10 | 90 days supply | Qty: 27 | Fill #0

## 2016-03-29 MED FILL — LEVOTHYROXINE 50 MCG TABLET: 50 | 30 days supply | Qty: 30 | Fill #5

## 2016-04-12 MED FILL — BUPROPION HCL XL 300 MG TAB: 300 | 90 days supply | Qty: 90 | Fill #0

## 2016-04-25 MED FILL — HYDROCHLOROTHIAZIDE 25 MG T: 25 | 90 days supply | Qty: 90 | Fill #0

## 2016-05-07 ENCOUNTER — Other Ambulatory Visit: Payer: Self-pay | Admitting: Endocrinology

## 2016-05-07 MED FILL — LEVOTHYROXINE 50 MCG TABLET: 50 | 30 days supply | Qty: 30 | Fill #0

## 2016-05-21 ENCOUNTER — Other Ambulatory Visit (INDEPENDENT_AMBULATORY_CARE_PROVIDER_SITE_OTHER): Payer: BLUE CROSS/BLUE SHIELD

## 2016-05-21 DIAGNOSIS — E038 Other specified hypothyroidism: Secondary | ICD-10-CM

## 2016-05-21 LAB — T3, FREE: T3, Free: 3 pg/mL (ref 2.3–4.2)

## 2016-05-21 LAB — T4, FREE: FREE T4: 0.76 ng/dL (ref 0.60–1.60)

## 2016-05-24 ENCOUNTER — Ambulatory Visit (INDEPENDENT_AMBULATORY_CARE_PROVIDER_SITE_OTHER): Payer: BLUE CROSS/BLUE SHIELD | Admitting: Endocrinology

## 2016-05-24 ENCOUNTER — Encounter: Payer: Self-pay | Admitting: Endocrinology

## 2016-05-24 VITALS — BP 134/81 | HR 87 | Resp 16 | Ht 62.0 in | Wt 269.6 lb

## 2016-05-24 DIAGNOSIS — Z23 Encounter for immunization: Secondary | ICD-10-CM | POA: Diagnosis not present

## 2016-05-24 DIAGNOSIS — E041 Nontoxic single thyroid nodule: Secondary | ICD-10-CM | POA: Diagnosis not present

## 2016-05-24 DIAGNOSIS — E038 Other specified hypothyroidism: Secondary | ICD-10-CM

## 2016-05-24 MED ORDER — LEVOTHYROXINE SODIUM 75 MCG PO TABS: 75.0000 ug | ORAL_TABLET | Freq: Every day | ORAL | 1 refills | Status: DC

## 2016-05-24 MED FILL — LEVOTHYROXINE 75 MCG TABLET: 75 | 90 days supply | Qty: 90 | Fill #0

## 2016-05-24 NOTE — Patient Instructions (Signed)
1 1/2 tabs daily 

## 2016-05-24 NOTE — Progress Notes (Signed)
Patient ID: Joyce Watts, female   DOB: 07/11/1961, 55 y.o.   MRN: [id number]   Reason for Appointment:  Secondary Hypothyroidism, follow-up visit    History of Present Illness:   She was seen in evaluation for secondary hypothyroidism in 04/2014 At that time she was having symptoms of fatigue, weight gain, memory difficulties, brittle nails and some hair loss; symptoms had started in 5/15  She was found to have a low free T4 of 0.59 by her PCP and referred here for further management. TSH previously was normal She was evaluated for pituitary hypofunction and all the labs were normal including appropriate level of  FSH  She has been taking levothyroxine 50 mcg daily since 8/15 Initially after starting supplementation patient had improved her energy level as well as less issues with concentration and memory. She thinks her hair is still thinning  Again has  fatigue but she thinks this is from not sleeping well  Her dose was continued at 50 g on the last follow-up visit Free T4 has come down gradually since 2016 and now is 0.76 TSH usually  low   Lab Results  Component Value Date   FREET4 0.76 05/21/2016   FREET4 0.86 11/17/2015   FREET4 0.98 05/22/2015   TSH 0.04 (L) 11/17/2015   TSH 0.06 (L) 05/22/2015   TSH 0.05 (L) 11/07/2014      Wt Readings from Last 3 Encounters:  05/24/16 269 lb 9.6 oz (122.3 kg)  11/22/15 272 lb 12.8 oz (123.7 kg)  05/25/15 280 lb 6.4 oz (127.2 kg)    PROBLEM 2:  Left thyroid nodule.   On her  initial exam she was found to have a thyroid nodule on the left side and an ultrasound this was 4.8 cm She does occasionally have difficulty swallowing liquids for several months but this is not significant.  No local pressure symptoms  Thyroid biopsy showed benign follicular cells    Past Medical History:  Diagnosis Date  . Anxiety   . Carpal tunnel syndrome   . Depression   . Hyperlipidemia    LDL in 6/15 = 133  . Migraines    Past history    No past surgical history on file.  Family History  Problem Relation Age of Onset  . Hypertension Father   . Diabetes Father   . Diabetes Maternal Grandfather   . Diabetes Paternal Grandfather   . Thyroid disease Neg Hx     Social History:  reports that she has never smoked. She has never used smokeless tobacco. Her alcohol and drug histories are not on file.  Allergies:  Allergies  Allergen Reactions  . Sulfa Antibiotics Rash      Medication List       Accurate as of 05/24/16  3:20 PM. Always use your most recent med list.          ALPRAZolam 0.5 MG tablet Commonly known as:  XANAX as needed.   buPROPion 300 MG 24 hr tablet Commonly known as:  WELLBUTRIN XL   escitalopram 10 MG tablet Commonly known as:  LEXAPRO   hydrochlorothiazide 25 MG tablet Commonly known as:  HYDRODIURIL as needed.   levothyroxine 50 MCG tablet Commonly known as:  SYNTHROID, LEVOTHROID TAKE 1 TABLET BY MOUTH ONCE DAILY   mupirocin cream 2 % Commonly known as:  BACTROBAN   rizatriptan 10 MG tablet Commonly known as:  MAXALT   VENTOLIN HFA 108 (90 Base) MCG/ACT inhaler Generic drug:  albuterol as  needed.       Review of Systems:  She is postmenopausal.          She takes HCTZ for edema       Examination:    BP (!) 136/91   Pulse 87   Resp 16   Ht 5\' 2"  (1.575 m)   Wt 269 lb 9.6 oz (122.3 kg)   SpO2 97%   BMI 49.31 kg/m   Thyroid exam shows firm smooth nodule felt in the left lower lobe on swallowing, Appears to be about 3 cm in size, may be under the sternomastoid partly  Biceps reflexes  normal Skin is normal  Assessments/Plan   1.  Secondary Hypothyroidism with low free T4 level and symptoms of fatigue, weight gain, memory difficulties and  hair loss She has done subjectively well with levothyroxine supplementation with more energy level and better mental function Again her free T4 is slightly lower than before and she does have some  nonspecific fatigue  She will be given a trial of 75 g of levothyroxine and follow-up in 4 months  2. Left thyroid nodule. This is clinically about the same in size as before and has been benign on biopsy and will follow every 6 months on exam    Shonna Deiter 05/24/2016, 3:20 PM

## 2016-07-11 MED FILL — HYDROCODON-APAP 5-325: 5-325 | 5 days supply | Qty: 30 | Fill #0

## 2016-07-18 MED FILL — BUPROPION HCL XL 300 MG TAB: 300 | 90 days supply | Qty: 90 | Fill #1

## 2016-08-12 MED FILL — ALPRAZolam 0.5 MG TABS: 0.5 | 20 days supply | Qty: 60 | Fill #1

## 2016-08-12 MED FILL — HYDROCHLOROTHIAZIDE 25 MG T: 25 | 90 days supply | Qty: 90 | Fill #1

## 2016-09-10 MED FILL — AMOXICILLIN 875 MG TABLET: 875 | 10 days supply | Qty: 20 | Fill #0

## 2016-09-18 ENCOUNTER — Other Ambulatory Visit: Payer: BLUE CROSS/BLUE SHIELD

## 2016-09-19 MED FILL — LEVOTHYROXINE 75 MCG TABLET: 75 | 90 days supply | Qty: 90 | Fill #1

## 2016-09-20 ENCOUNTER — Other Ambulatory Visit: Payer: BLUE CROSS/BLUE SHIELD

## 2016-09-23 ENCOUNTER — Ambulatory Visit: Payer: BLUE CROSS/BLUE SHIELD | Admitting: Endocrinology

## 2016-10-04 ENCOUNTER — Other Ambulatory Visit (INDEPENDENT_AMBULATORY_CARE_PROVIDER_SITE_OTHER): Payer: BLUE CROSS/BLUE SHIELD

## 2016-10-04 ENCOUNTER — Other Ambulatory Visit: Payer: BLUE CROSS/BLUE SHIELD

## 2016-10-04 DIAGNOSIS — E038 Other specified hypothyroidism: Secondary | ICD-10-CM | POA: Diagnosis not present

## 2016-10-04 LAB — T4, FREE: FREE T4: 1.32 ng/dL (ref 0.60–1.60)

## 2016-10-04 LAB — TSH: TSH: 0.15 u[IU]/mL — AB (ref 0.35–4.50)

## 2016-10-09 ENCOUNTER — Ambulatory Visit: Payer: BLUE CROSS/BLUE SHIELD | Admitting: Endocrinology

## 2016-10-13 NOTE — Progress Notes (Signed)
Patient ID: Joyce Watts, female   DOB: 11/13/1960, 56 y.o.   MRN: [id number]   Reason for Appointment:  Secondary Hypothyroidism, follow-up visit    History of Present Illness:   She was seen in evaluation for secondary hypothyroidism in 04/2014 At that time she was having symptoms of fatigue, weight gain, memory difficulties, brittle nails and some hair loss; symptoms had started in 5/15  She was found to have a low free T4 of 0.59 by her PCP and referred here for further management. TSH previously was normal She was evaluated for pituitary hypofunction and all the labs were normal including appropriate level of  FSH  She has been taking levothyroxine 75 g daily since 9/17, previously on 50 Initially after starting supplementation patient had improved her energy level as well as less issues with concentration and memory. She may be also doing a little better with her last increase in dose when she was having some fatigue and free T4 was 0.76  Free T4 has come up significantly, she does not take biotin TSH usually  low, currently not as low   Lab Results  Component Value Date   FREET4 1.32 10/04/2016   FREET4 0.76 05/21/2016   FREET4 0.86 11/17/2015   TSH 0.15 (L) 10/04/2016   TSH 0.04 (L) 11/17/2015   TSH 0.06 (L) 05/22/2015      Wt Readings from Last 3 Encounters:  10/14/16 266 lb (120.7 kg)  05/24/16 269 lb 9.6 oz (122.3 kg)  11/22/15 272 lb 12.8 oz (123.7 kg)    PROBLEM 2:  Left thyroid nodule.   On her  initial exam she was found to have a thyroid nodule on the left side and an ultrasound this was 4.8 cm She does occasionally have difficulty swallowing liquids for several months but this is not significant.  No local pressure symptoms  Thyroid biopsy showed benign follicular cells    Past Medical History:  Diagnosis Date  . Anxiety   . Carpal tunnel syndrome   . Depression   . Hyperlipidemia    LDL in 6/15 = 133  . Migraines    Past history      No past surgical history on file.  Family History  Problem Relation Age of Onset  . Hypertension Father   . Diabetes Father   . Diabetes Maternal Grandfather   . Diabetes Paternal Grandfather   . Thyroid disease Neg Hx     Social History:  reports that she has never smoked. She has never used smokeless tobacco. Her alcohol and drug histories are not on file.  Allergies:  Allergies  Allergen Reactions  . Sulfa Antibiotics Rash    Allergies as of 10/14/2016      Reactions   Sulfa Antibiotics Rash      Medication List       Accurate as of 10/14/16  8:11 AM. Always use your most recent med list.          ALPRAZolam 0.5 MG tablet Commonly known as:  XANAX as needed.   buPROPion 300 MG 24 hr tablet Commonly known as:  WELLBUTRIN XL   hydrochlorothiazide 25 MG tablet Commonly known as:  HYDRODIURIL as needed.   levothyroxine 75 MCG tablet Commonly known as:  SYNTHROID, LEVOTHROID Take 1 tablet (75 mcg total) by mouth daily.   mupirocin cream 2 % Commonly known as:  BACTROBAN   rizatriptan 10 MG tablet Commonly known as:  MAXALT   VENTOLIN HFA 108 (90 Base)  MCG/ACT inhaler Generic drug:  albuterol as needed.       Review of Systems:  She is postmenopausal since about 2013.        Has not had a baseline bone density    She takes HCTZ for edema       Examination:    BP 120/68   Pulse 72   Ht 5\' 2"  (1.575 m)   Wt 266 lb (120.7 kg)   SpO2 95%   BMI 48.65 kg/m   Thyroid exam shows firm smooth nodule felt in the left lower lobe This is mostly felt on swallowing and is about 2.5- 3 cm in size and relatively medial and location No nodule on the right side No lymphadenopathy Skin is normal  Assessments/Plan   1.  Secondary Hypothyroidism with baseline low free T4 level and symptoms of fatigue, weight gain, memory difficulties and  hair loss She has done subjectively well with levothyroxine supplementation  Also may be doing relatively better  with increasing her dose to 75 g when her free T4 level was low normal with more energy level and better mental function Free T4 is mid normal, TSH not a suppressed as before She was advised to continue the 75 g dose and follow-up in 6 months   2. Left thyroid nodule. This is clinically about the same in size Since this has been benign on biopsy and will follow every 6 months on exam  3.  Postmenopausal state: She has not had a baseline bone density and is about 5-6 years postmenopausal.  Since she is on long-term thyroid supplementation would recommend bone density which she can get the same time as her mammogram the summer    [facility] 10/14/2016, 8:11 AM

## 2016-10-14 ENCOUNTER — Ambulatory Visit (INDEPENDENT_AMBULATORY_CARE_PROVIDER_SITE_OTHER): Payer: BLUE CROSS/BLUE SHIELD | Admitting: Endocrinology

## 2016-10-14 ENCOUNTER — Encounter: Payer: Self-pay | Admitting: Endocrinology

## 2016-10-14 VITALS — BP 120/68 | HR 72 | Ht 62.0 in | Wt 266.0 lb

## 2016-10-14 DIAGNOSIS — E038 Other specified hypothyroidism: Secondary | ICD-10-CM

## 2016-10-14 NOTE — Patient Instructions (Signed)
Get bone density in 7/18

## 2016-10-23 MED FILL — BUPROPION HCL XL 300 MG TAB: 300 | 90 days supply | Qty: 90 | Fill #2

## 2016-12-13 MED FILL — HYDROCHLOROTHIAZIDE 25 MG T: 25 | 90 days supply | Qty: 90 | Fill #2

## 2017-01-29 ENCOUNTER — Other Ambulatory Visit: Payer: Self-pay | Admitting: Endocrinology

## 2017-01-29 MED FILL — LEVOTHYROXINE 75 MCG TABLET: 75 | 90 days supply | Qty: 90 | Fill #0

## 2017-01-29 MED FILL — BUPROPION HCL XL 300 MG TAB: 300 | 90 days supply | Qty: 90 | Fill #3

## 2017-04-11 ENCOUNTER — Other Ambulatory Visit (INDEPENDENT_AMBULATORY_CARE_PROVIDER_SITE_OTHER): Payer: BLUE CROSS/BLUE SHIELD

## 2017-04-11 DIAGNOSIS — E038 Other specified hypothyroidism: Secondary | ICD-10-CM

## 2017-04-11 LAB — TSH

## 2017-04-11 LAB — T4, FREE: Free T4: 0.99 ng/dL (ref 0.60–1.60)

## 2017-04-15 ENCOUNTER — Ambulatory Visit (INDEPENDENT_AMBULATORY_CARE_PROVIDER_SITE_OTHER): Payer: BLUE CROSS/BLUE SHIELD | Admitting: Endocrinology

## 2017-04-15 ENCOUNTER — Encounter: Payer: Self-pay | Admitting: Endocrinology

## 2017-04-15 VITALS — BP 134/82 | HR 83 | Ht 62.0 in | Wt 266.4 lb

## 2017-04-15 DIAGNOSIS — E041 Nontoxic single thyroid nodule: Secondary | ICD-10-CM | POA: Diagnosis not present

## 2017-04-15 DIAGNOSIS — E038 Other specified hypothyroidism: Secondary | ICD-10-CM

## 2017-04-15 NOTE — Progress Notes (Signed)
Patient ID: Joyce Watts, female   DOB: 1961/05/17, 56 y.o.   MRN: [id number]   Reason for Appointment:  Secondary Hypothyroidism, follow-up visit    History of Present Illness:   She was seen in evaluation for secondary hypothyroidism in 04/2014 At that time she was having symptoms of fatigue, weight gain, memory difficulties, brittle nails and some hair loss; symptoms had started in 5/15  She was found to have a low free T4 of 0.59 by her PCP and referred here for further management. TSH previously was normal She was evaluated for pituitary hypofunction and all the labs were normal including appropriate level of  FSH Initially after starting supplementation patient had improved her energy level as well as less issues with concentration and memory.  Recent history: She has been taking levothyroxine 75 g daily since 9/17, previously on 50 g daily She had fairly good energy level on her last visit and no symptoms of shakiness of palpitations even though her free T4 was relatively high at 1.3 She feels about the same now However even with the same dose her free T4 level is now 0.99 and TSH is suppressed  Again she does not have any abnormal symptoms as above She does not take biotin   Lab Results  Component Value Date   FREET4 0.99 04/11/2017   FREET4 1.32 10/04/2016   FREET4 0.76 05/21/2016   TSH <0.01 (L) 04/11/2017   TSH 0.15 (L) 10/04/2016   TSH 0.04 (L) 11/17/2015      Wt Readings from Last 3 Encounters:  04/15/17 266 lb 6.4 oz (120.8 kg)  10/14/16 266 lb (120.7 kg)  05/24/16 269 lb 9.6 oz (122.3 kg)    PROBLEM 2:  Left thyroid nodule.   On her  initial exam she was found to have a thyroid nodule on the left side and an ultrasound this was 4.8 cm No local pressure symptoms, Occasionally may feel a little tenderness No dysphagia  Thyroid biopsy showed benign follicular cells    Past Medical History:  Diagnosis Date  . Anxiety   . Carpal tunnel  syndrome   . Depression   . Hyperlipidemia    LDL in 6/15 = 133  . Migraines    Past history    No past surgical history on file.  Family History  Problem Relation Age of Onset  . Hypertension Father   . Diabetes Father   . Diabetes Maternal Grandfather   . Diabetes Paternal Grandfather   . Thyroid disease Neg Hx     Social History:  reports that she has never smoked. She has never used smokeless tobacco. Her alcohol and drug histories are not on file.  Allergies:  Allergies  Allergen Reactions  . Sulfa Antibiotics Rash    Allergies as of 04/15/2017      Reactions   Sulfa Antibiotics Rash      Medication List       Accurate as of 04/15/17  4:37 PM. Always use your most recent med list.          ALPRAZolam 0.5 MG tablet Commonly known as:  XANAX as needed.   buPROPion 300 MG 24 hr tablet Commonly known as:  WELLBUTRIN XL   hydrochlorothiazide 25 MG tablet Commonly known as:  HYDRODIURIL as needed.   levothyroxine 75 MCG tablet Commonly known as:  SYNTHROID, LEVOTHROID TAKE 1 TABLET BY MOUTH DAILY.   mupirocin cream 2 % Commonly known as:  BACTROBAN   rizatriptan 10 MG  tablet Commonly known as:  MAXALT   VENTOLIN HFA 108 (90 Base) MCG/ACT inhaler Generic drug:  albuterol as needed.       Review of Systems:  She is postmenopausal since about 2013.        Has not had a baseline bone density, Also is due for her mammogram    She takes HCTZ for edema       Examination:    BP 134/82   Pulse 83   Ht 5\' 2"  (1.575 m)   Wt 266 lb 6.4 oz (120.8 kg)   SpO2 97%   BMI 48.73 kg/m   Thyroid exam shows firm smooth nodule felt in the left lower lobe, 2. 5-3 centimeters in size, nontender This is felt better on swallowing No nodule on the right side No lymphadenopathy Skin is normal Biceps reflexes appear normal, difficult to elicit  Assessments/Plan   1.  Secondary Hypothyroidism with baseline low free T4 level and symptoms of fatigue, weight  gain, memory difficulties and  hair loss She has done subjectively well with levothyroxine supplementation   Her dosage of 75 g has been continued with subjective improvement Free T4 is quite normal However TSH is low and not clear if this is related to hypopituitarism She does not show any signs of excessive thyroid supplementation   2. Left thyroid nodule. This is clinically about the same in size Since this has been benign on biopsy and will follow every 6 months on exam  3.  Postmenopausal state: She still has not had a baseline bone density and is about 6-7 years postmenopausal.   Since she is on long-term thyroid supplementation would recommend bone density Recommended that she can do it when she goes for her mammogram and reminded her to schedule this    [facility] Institute[facility]UMAR,Joyce Watts 04/15/2017, 4:37 PM

## 2017-04-22 MED FILL — buPROPion HCL ER (XL) 300 M: 300 | 90 days supply | Qty: 90 | Fill #0

## 2017-04-22 MED FILL — HYDROCHLOROTHIAZIDE 25 MG T: 25 | 90 days supply | Qty: 90 | Fill #0

## 2017-05-20 MED FILL — LEVOTHYROXINE 75 MCG TABLET: 75 | 90 days supply | Qty: 90 | Fill #1

## 2017-07-07 MED FILL — AMOXICILLIN 875 MG TABLET: 875 | 10 days supply | Qty: 20 | Fill #0

## 2017-07-22 MED FILL — AMOX TR-K CLV 875-125 MG TA: 875-125 | 10 days supply | Qty: 20 | Fill #0

## 2017-08-06 MED FILL — BUPROPION HCL XL 300 MG TAB: 300 | 30 days supply | Qty: 30 | Fill #0

## 2017-09-11 MED FILL — BUPROPION HCL XL 300 MG TAB: 300 | 30 days supply | Qty: 30 | Fill #0

## 2017-09-11 MED FILL — HYDROCHLOROTHIAZIDE 25 MG T: 25 | 90 days supply | Qty: 90 | Fill #0

## 2017-09-11 MED FILL — ALPRAZolam 0.5 MG TABS: 0.5 | 10 days supply | Qty: 30 | Fill #0

## 2017-09-29 MED FILL — DESONIDE 0.05% OINTMENT: 0.05 | 14 days supply | Qty: 30 | Fill #0

## 2017-10-06 ENCOUNTER — Other Ambulatory Visit: Payer: Self-pay | Admitting: Endocrinology

## 2017-10-06 MED FILL — LEVOTHYROXINE 75 MCG TABLET: 75 | 90 days supply | Qty: 90 | Fill #0

## 2017-10-06 MED FILL — MUPIROCIN 2% OINTMENT: 2 | 7 days supply | Qty: 22 | Fill #0

## 2017-10-15 MED FILL — BUPROPION HCL XL 300 MG TAB: 300 | 30 days supply | Qty: 30 | Fill #1

## 2017-10-16 ENCOUNTER — Other Ambulatory Visit (INDEPENDENT_AMBULATORY_CARE_PROVIDER_SITE_OTHER): Payer: BLUE CROSS/BLUE SHIELD

## 2017-10-16 DIAGNOSIS — E038 Other specified hypothyroidism: Secondary | ICD-10-CM

## 2017-10-16 LAB — T3, FREE: T3, Free: 3.4 pg/mL (ref 2.3–4.2)

## 2017-10-16 LAB — T4, FREE: Free T4: 0.72 ng/dL (ref 0.60–1.60)

## 2017-10-16 LAB — TSH: TSH: 0.87 u[IU]/mL (ref 0.35–4.50)

## 2017-10-20 MED FILL — PROAIR HFA 90 MCG INHALER: 108 (90 BAS | 50 days supply | Qty: 9 | Fill #0

## 2017-10-20 NOTE — Progress Notes (Signed)
Patient ID: Joyce Watts, female   DOB: [birth year], 57 y.o.   MRN: [id number]   Reason for Appointment:  Secondary Hypothyroidism, follow-up visit    History of Present Illness:   She was seen in evaluation for secondary hypothyroidism in 04/2014 At that time she was having symptoms of fatigue, weight gain, memory difficulties, brittle nails and some hair loss; symptoms had started in 5/15  She was found to have a low free T4 of 0.59 by her PCP and referred here for further management. TSH previously was normal She was evaluated for pituitary hypofunction and all the labs were normal including appropriate level of  FSH Initially after starting supplementation patient had improved her energy level as well as less issues with concentration and memory.  Recent history:  She has been treated with levothyroxine 75 g daily since 9/17, previously on 50 g daily  She had fairly good energy level  Her weight is down slightly He does not complain of any unusual cold intolerance, no hair loss She is very regular with taking a supplement before breakfast  However her free T4 appears to be trending lower over the last 3 levels   Lab Results  Component Value Date   FREET4 0.72 10/16/2017   FREET4 0.99 04/11/2017   FREET4 1.32 10/04/2016   TSH 0.87 10/16/2017   TSH <0.01 (L) 04/11/2017   TSH 0.15 (L) 10/04/2016      Wt Readings from Last 3 Encounters:  10/21/17 260 lb (117.9 kg)  04/15/17 266 lb 6.4 oz (120.8 kg)  10/14/16 266 lb (120.7 kg)    PROBLEM 2:  Left thyroid nodule.   On her  initial exam she was found to have a thyroid nodule on the left side and an ultrasound this was 4.8 cm No local pressure symptoms or choking again No dysphagia  Thyroid biopsy showed benign follicular cells    Past Medical History:  Diagnosis Date  . Anxiety   . Carpal tunnel syndrome   . Depression   . Hyperlipidemia    LDL in 6/15 = 133  . Migraines    Past history     History reviewed. No pertinent surgical history.  Family History  Problem Relation Age of Onset  . Hypertension Father   . Diabetes Father   . Diabetes Maternal Grandfather   . Diabetes Paternal Grandfather   . Thyroid disease Neg Hx     Social History:  reports that  has never smoked. she has never used smokeless tobacco. Her alcohol and drug histories are not on file.  Allergies:  Allergies  Allergen Reactions  . Sulfa Antibiotics Rash    Allergies as of 10/21/2017      Reactions   Sulfa Antibiotics Rash      Medication List        Accurate as of 10/21/17  5:12 PM. Always use your most recent med list.          ALPRAZolam 0.5 MG tablet Commonly known as:  XANAX as needed.   buPROPion 300 MG 24 hr tablet Commonly known as:  WELLBUTRIN XL   hydrochlorothiazide 25 MG tablet Commonly known as:  HYDRODIURIL as needed.   levothyroxine 88 MCG tablet Commonly known as:  SYNTHROID, LEVOTHROID Take 1 tablet (88 mcg total) by mouth daily.   mupirocin cream 2 % Commonly known as:  BACTROBAN   rizatriptan 10 MG tablet Commonly known as:  MAXALT   VENTOLIN HFA 108 (90 Base) MCG/ACT inhaler Generic  drug:  albuterol as needed.       Review of Systems:  She is postmenopausal since about 2013.           She takes HCTZ for edema   from her PCP     Examination:    BP 136/78   Pulse 78   Ht 5\' 2"  (1.575 m)   Wt 260 lb (117.9 kg)   SpO2 96%   BMI 47.55 kg/m   Thyroid exam shows firm smooth nodule felt in the left lower lobe mostly medially, about 3 centimeters in size  No nodule on the right side No lymphadenopathy  Biceps reflexes appear normal, difficult to elicit  Assessments/Plan   1.  Secondary Hypothyroidism with baseline low free T4 level and symptoms of fatigue, weight gain, memory difficulties and  hair loss She has done subjectively well with levothyroxine supplementation since about 2015  Her dosage of 75 g has been continued with  subjective improvement However even though she is feeling quite well reportedly her free T4 is trending lower and now only 0.72  She is very regular with her thyroid supplement  Will go up to her new dose of 88 mcg since she likely is getting more progression She can use an extra half tablet weekly to finish up her 75 dose We will also check T3 level on the next visit  2. Left thyroid nodule. This is clinically about the same in size Since this has been benign on biopsy will not need further ultrasound exams: She is asymptomatic again  She will need to continue with PCP for preventive care   Reather LittlerAjay Ned Kakar 10/21/2017, 5:12 PM

## 2017-10-21 ENCOUNTER — Encounter: Payer: Self-pay | Admitting: Endocrinology

## 2017-10-21 ENCOUNTER — Ambulatory Visit (INDEPENDENT_AMBULATORY_CARE_PROVIDER_SITE_OTHER): Payer: BLUE CROSS/BLUE SHIELD | Admitting: Endocrinology

## 2017-10-21 VITALS — BP 136/78 | HR 78 | Ht 62.0 in | Wt 260.0 lb

## 2017-10-21 DIAGNOSIS — E038 Other specified hypothyroidism: Secondary | ICD-10-CM

## 2017-10-21 MED ORDER — LEVOTHYROXINE SODIUM 88 MCG PO TABS
88.0000 ug | ORAL_TABLET | Freq: Every day | ORAL | 3 refills | Status: DC
Start: 2017-10-21 — End: 2018-11-04

## 2017-10-21 MED FILL — LEVOTHYROXINE 88 MCG TABLET: 88 | 90 days supply | Qty: 90 | Fill #0

## 2017-10-28 MED FILL — DOXYCYCLINE HYC 50 MG CAP: 50 | 30 days supply | Qty: 30 | Fill #0

## 2017-11-07 MED FILL — BUPROPION HCL XL 300 MG TAB: 300 | 90 days supply | Qty: 90 | Fill #0

## 2017-11-10 MED FILL — TACROLIMUS 0.1 % OINT: 0.1 | 30 days supply | Qty: 30 | Fill #0

## 2017-12-16 DIAGNOSIS — D729 Disorder of white blood cells, unspecified: Secondary | ICD-10-CM | POA: Diagnosis not present

## 2018-01-08 DIAGNOSIS — M170 Bilateral primary osteoarthritis of knee: Secondary | ICD-10-CM | POA: Insufficient documentation

## 2018-01-08 DIAGNOSIS — M1711 Unilateral primary osteoarthritis, right knee: Secondary | ICD-10-CM | POA: Diagnosis not present

## 2018-01-08 DIAGNOSIS — M1712 Unilateral primary osteoarthritis, left knee: Secondary | ICD-10-CM | POA: Diagnosis not present

## 2018-01-12 DIAGNOSIS — J069 Acute upper respiratory infection, unspecified: Secondary | ICD-10-CM | POA: Diagnosis not present

## 2018-01-12 DIAGNOSIS — J029 Acute pharyngitis, unspecified: Secondary | ICD-10-CM | POA: Diagnosis not present

## 2018-01-12 MED FILL — HYDROCODONE-HOMATROPINE SOL: 5-1.5 | 7 days supply | Qty: 140 | Fill #0

## 2018-01-12 MED FILL — AMOXICILLIN 875 MG TABLET: 875 | 10 days supply | Qty: 20 | Fill #0

## 2018-02-12 MED FILL — buPROPion HCL ER (XL) 300 M: 300 | 90 days supply | Qty: 90 | Fill #1

## 2018-02-16 MED FILL — HYDROCHLOROTHIAZIDE 25 MG T: 25 | 90 days supply | Qty: 90 | Fill #0

## 2018-02-20 ENCOUNTER — Other Ambulatory Visit (INDEPENDENT_AMBULATORY_CARE_PROVIDER_SITE_OTHER): Payer: BLUE CROSS/BLUE SHIELD

## 2018-02-20 DIAGNOSIS — E038 Other specified hypothyroidism: Secondary | ICD-10-CM

## 2018-02-20 LAB — TSH: TSH: 0.07 u[IU]/mL — ABNORMAL LOW (ref 0.35–4.50)

## 2018-02-20 LAB — T4, FREE: FREE T4: 0.88 ng/dL (ref 0.60–1.60)

## 2018-02-20 LAB — T3, FREE: T3 FREE: 3.1 pg/mL (ref 2.3–4.2)

## 2018-02-23 NOTE — Progress Notes (Signed)
Patient ID: Joyce Watts, female   DOB: [birth year], 57 y.o.   MRN: [id number]   Reason for Appointment:  Secondary Hypothyroidism, follow-up visit    History of Present Illness:   She was seen in evaluation for secondary hypothyroidism in 04/2014 At that time she was having symptoms of fatigue, weight gain, memory difficulties, brittle nails and some hair loss; symptoms had started in 5/15  She was found to have a low free T4 of 0.59 by her PCP and referred here for further management. TSH previously was normal She was evaluated for pituitary hypofunction and all the labs were normal including appropriate level of  FSH Initially after starting supplementation patient had improved her energy level as well as less issues with concentration and memory.  Recent history:  She had been treated with levothyroxine 75 g daily since 9/17, previously on 50 g daily  However since her free T4 was trending lower when she was seen in 10/2017 her dose was increased up to 88 mcg daily  She had fairly good energy level she thinks she has a little improvement on her last visit   Her weight is down slightly again  No recent cold intolerance, no hair loss She has some difficulty with attention deficit but no memory loss  She is very regular with taking a supplement before breakfast Free T4 is better and free T3 is normal also   Lab Results  Component Value Date   FREET4 0.88 02/20/2018   FREET4 0.72 10/16/2017   FREET4 0.99 04/11/2017   TSH 0.07 (L) 02/20/2018   TSH 0.87 10/16/2017   TSH <0.01 (L) 04/11/2017      Wt Readings from Last 3 Encounters:  02/24/18 257 lb 12.8 oz (116.9 kg)  10/21/17 260 lb (117.9 kg)  04/15/17 266 lb 6.4 oz (120.8 kg)    PROBLEM 2:  Left thyroid nodule.   On her  initial exam in 2015 she was found to have a thyroid nodule on the left side and an ultrasound this was 4.8 cm No local pressure symptoms or choking sensation No difficulty  swallowing  Thyroid biopsy showed benign follicular cells    Past Medical History:  Diagnosis Date  . Anxiety   . Carpal tunnel syndrome   . Depression   . Hyperlipidemia    LDL in 6/15 = 133  . Migraines    Past history    History reviewed. No pertinent surgical history.  Family History  Problem Relation Age of Onset  . Hypertension Father   . Diabetes Father   . Diabetes Maternal Grandfather   . Diabetes Paternal Grandfather   . Thyroid disease Neg Hx     Social History:  reports that she has never smoked. She has never used smokeless tobacco. Her alcohol and drug histories are not on file.  Allergies:  Allergies  Allergen Reactions  . Sulfa Antibiotics Rash    Allergies as of 02/24/2018      Reactions   Sulfa Antibiotics Rash      Medication List        Accurate as of 02/24/18  8:13 AM. Always use your most recent med list.          ALPRAZolam 0.5 MG tablet Commonly known as:  XANAX as needed.   buPROPion 300 MG 24 hr tablet Commonly known as:  WELLBUTRIN XL   hydrochlorothiazide 25 MG tablet Commonly known as:  HYDRODIURIL as needed.   levothyroxine 88 MCG tablet Commonly known  as:  SYNTHROID, LEVOTHROID Take 1 tablet (88 mcg total) by mouth daily.   mupirocin cream 2 % Commonly known as:  BACTROBAN   rizatriptan 10 MG tablet Commonly known as:  MAXALT   VENTOLIN HFA 108 (90 Base) MCG/ACT inhaler Generic drug:  albuterol as needed.       Review of Systems:  She is postmenopausal since about 2013.           She takes HCTZ for edema   from her PCP     Examination:    BP 132/80 (BP Location: Left Arm, Patient Position: Sitting, Cuff Size: Large)   Pulse 89   Ht 5\' 2"  (1.575 m)   Wt 257 lb 12.8 oz (116.9 kg)   SpO2 95%   BMI 47.15 kg/m   Thyroid exam shows firm smooth nodule felt in the left lower lobe mostly medially, difficult to estimate size but likely about 3 centimeters in size  No thyroid enlargement on the  right  No local lymph nodes felt  Biceps reflexes and triceps appear normal, difficult to elicit   Assessments/Plan   1.  Secondary Hypothyroidism with baseline low free T4 level and symptoms of fatigue, weight gain, memory difficulties and  hair loss She has reduced symptoms with levothyroxine supplementation since about 2015  Her symptoms of mild fatigue were better with increasing her dose to 88 mcg and has no new symptoms Weight is somewhat better She has been compliant with her supplement before breakfast Free T4 is improved and TSH relatively low as before  2. Left thyroid nodule previously 4.8 cm on the scan. This is clinically about the same in size Previously has been benign on biopsy will not need further ultrasound exams:   Encouraged her to start exercise regimen   Reather LittlerAjay Azyria Osmon 02/24/2018, 8:13 AM

## 2018-02-24 ENCOUNTER — Encounter: Payer: Self-pay | Admitting: Endocrinology

## 2018-02-24 ENCOUNTER — Ambulatory Visit (INDEPENDENT_AMBULATORY_CARE_PROVIDER_SITE_OTHER): Payer: BLUE CROSS/BLUE SHIELD | Admitting: Endocrinology

## 2018-02-24 VITALS — BP 132/80 | HR 89 | Ht 62.0 in | Wt 257.8 lb

## 2018-02-24 DIAGNOSIS — E038 Other specified hypothyroidism: Secondary | ICD-10-CM

## 2018-04-30 MED FILL — LEVOTHYROXINE 88 MCG TABLET: 88 | 90 days supply | Qty: 90 | Fill #1

## 2018-05-14 MED FILL — buPROPion HCL ER (XL) 300 M: 300 | 90 days supply | Qty: 90 | Fill #2

## 2018-05-29 MED FILL — HYDROCHLOROTHIAZIDE 25 MG T: 25 | 90 days supply | Qty: 90 | Fill #1

## 2018-06-03 DIAGNOSIS — M1712 Unilateral primary osteoarthritis, left knee: Secondary | ICD-10-CM | POA: Diagnosis not present

## 2018-06-03 DIAGNOSIS — M1711 Unilateral primary osteoarthritis, right knee: Secondary | ICD-10-CM | POA: Diagnosis not present

## 2018-06-10 DIAGNOSIS — M1712 Unilateral primary osteoarthritis, left knee: Secondary | ICD-10-CM | POA: Diagnosis not present

## 2018-06-10 DIAGNOSIS — M1711 Unilateral primary osteoarthritis, right knee: Secondary | ICD-10-CM | POA: Diagnosis not present

## 2018-06-17 DIAGNOSIS — M1711 Unilateral primary osteoarthritis, right knee: Secondary | ICD-10-CM | POA: Diagnosis not present

## 2018-06-17 DIAGNOSIS — M1712 Unilateral primary osteoarthritis, left knee: Secondary | ICD-10-CM | POA: Diagnosis not present

## 2018-07-13 DIAGNOSIS — R0982 Postnasal drip: Secondary | ICD-10-CM | POA: Diagnosis not present

## 2018-07-13 DIAGNOSIS — Z23 Encounter for immunization: Secondary | ICD-10-CM | POA: Diagnosis not present

## 2018-07-13 DIAGNOSIS — R05 Cough: Secondary | ICD-10-CM | POA: Diagnosis not present

## 2018-08-12 MED FILL — buPROPion HCL ER (XL) 300 M: 300 | 90 days supply | Qty: 90 | Fill #3

## 2018-08-18 ENCOUNTER — Other Ambulatory Visit (INDEPENDENT_AMBULATORY_CARE_PROVIDER_SITE_OTHER): Payer: BLUE CROSS/BLUE SHIELD

## 2018-08-18 DIAGNOSIS — E038 Other specified hypothyroidism: Secondary | ICD-10-CM

## 2018-08-18 LAB — T4, FREE: FREE T4: 0.78 ng/dL (ref 0.60–1.60)

## 2018-08-18 LAB — TSH: TSH: 0.45 u[IU]/mL (ref 0.35–4.50)

## 2018-08-18 LAB — T3, FREE: T3 FREE: 3.1 pg/mL (ref 2.3–4.2)

## 2018-08-19 MED FILL — LEVOTHYROXINE 88 MCG TABLET: 88 | 90 days supply | Qty: 90 | Fill #2

## 2018-08-21 ENCOUNTER — Encounter: Payer: Self-pay | Admitting: Endocrinology

## 2018-08-21 ENCOUNTER — Ambulatory Visit (INDEPENDENT_AMBULATORY_CARE_PROVIDER_SITE_OTHER): Payer: BLUE CROSS/BLUE SHIELD | Admitting: Endocrinology

## 2018-08-21 VITALS — BP 130/84 | HR 81 | Ht 62.0 in | Wt 262.0 lb

## 2018-08-21 DIAGNOSIS — E041 Nontoxic single thyroid nodule: Secondary | ICD-10-CM

## 2018-08-21 DIAGNOSIS — E038 Other specified hypothyroidism: Secondary | ICD-10-CM

## 2018-08-21 NOTE — Patient Instructions (Signed)
Take extra Synthroid pill 1x per week

## 2018-08-21 NOTE — Progress Notes (Signed)
Patient ID: Dannielle BurnLora A Sol, female   DOB: 02/07/1961, 57 y.o.   MRN: [id number]   Reason for Appointment:  Secondary Hypothyroidism, follow-up visit    History of Present Illness:   She was seen in evaluation for secondary hypothyroidism in 04/2014 At that time she was having symptoms of fatigue, weight gain, memory difficulties, brittle nails and some hair loss; symptoms had started in 5/15  She was found to have a low free T4 of 0.59 by her PCP and referred here for further management. TSH previously was normal She was evaluated for pituitary hypofunction and all the labs were normal including appropriate level of  FSH Initially after starting supplementation patient had improved her energy level as well as less issues with concentration and memory.  Recent history:  She had been treated with levothyroxine 88 mcg daily recently, had a dosage increase in 2/19 9 with her dosage increase she had a slight improvement in her energy level  She is now very concerned about her nail splitting, some hair loss Does not think she has any change in her energy level She may be somewhat colder than usual also, last winter was tending to be more hot Has also some difficulty focusing or memory  She is very regular with taking levothyroxine supplement before breakfast Free T4 is slightly lower although free T3 is about the same as 6 months ago TSH not suppressed for the bottom  Lab Results  Component Value Date   FREET4 0.78 08/18/2018   FREET4 0.88 02/20/2018   FREET4 0.72 10/16/2017   TSH 0.45 08/18/2018   TSH 0.07 (L) 02/20/2018   TSH 0.87 10/16/2017      Lab Results  Component Value Date   T3FREE 3.1 08/18/2018   T3FREE 3.1 02/20/2018   T3FREE 3.4 10/16/2017   T3FREE 3.0 05/21/2016   T3FREE 3.7 05/22/2015    Wt Readings from Last 3 Encounters:  08/21/18 262 lb (118.8 kg)  02/24/18 257 lb 12.8 oz (116.9 kg)  10/21/17 260 lb (117.9 kg)    PROBLEM 2:  Left thyroid  nodule.   On her  initial exam in 2015 she was found to have a thyroid nodule on the left side and on ultrasound this was a solid nodule, measuring 4.8 cm She may have occasional difficulty swallowing which is not new She may feel a little fullness but no choking and this is not new  Thyroid biopsy showed benign follicular cells    Past Medical History:  Diagnosis Date  . Anxiety   . Carpal tunnel syndrome   . Depression   . Hyperlipidemia    LDL in 6/15 = 133  . Migraines    Past history    History reviewed. No pertinent surgical history.  Family History  Problem Relation Age of Onset  . Hypertension Father   . Diabetes Father   . Diabetes Maternal Grandfather   . Diabetes Paternal Grandfather   . Thyroid disease Neg Hx     Social History:  reports that she has never smoked. She has never used smokeless tobacco. No history on file for alcohol and drug.  Allergies:  Allergies  Allergen Reactions  . Sulfa Antibiotics Rash    Allergies as of 08/21/2018      Reactions   Sulfa Antibiotics Rash      Medication List       Accurate as of August 21, 2018  8:15 AM. Always use your most recent med list.  ALPRAZolam 0.5 MG tablet Commonly known as:  XANAX as needed.   buPROPion 300 MG 24 hr tablet Commonly known as:  WELLBUTRIN XL   hydrochlorothiazide 25 MG tablet Commonly known as:  HYDRODIURIL as needed.   levothyroxine 88 MCG tablet Commonly known as:  SYNTHROID, LEVOTHROID Take 1 tablet (88 mcg total) by mouth daily.   mupirocin cream 2 % Commonly known as:  BACTROBAN   rizatriptan 10 MG tablet Commonly known as:  MAXALT   VENTOLIN HFA 108 (90 Base) MCG/ACT inhaler Generic drug:  albuterol as needed.       Review of Systems:  She is postmenopausal since about 2013.           She takes HCTZ for edema   from her PCP     Examination:    BP 130/84 (BP Location: Left Arm, Patient Position: Sitting, Cuff Size: Normal)   Pulse 81    Ht 5\' 2"  (1.575 m)   Wt 262 lb (118.8 kg)   SpO2 95%   BMI 47.92 kg/m   Thyroid exam shows firm smooth nodule felt in the left lower lobe especially medially This is about 3-3.5 cm across  Right lobe not palpable No lymphadenopathy in the neck  Skin appears normal  Biceps reflexes appear normal  Assessments/Plan   1.  Secondary Hypothyroidism with baseline low free T4 level and symptoms of fatigue, weight gain, memory difficulties and  hair loss She has reduced symptoms with levothyroxine supplementation since about 2015  She is not complaining of occultly with memory, some hair loss and nail changes but no significant fatigue She takes supplement daily before breakfast Free T4 is relatively lower compared to last time  She may be getting somewhat more hypothyroid and will give her a trial of extra 88 mcg weekly since she just got her refill Follow-up in 3 months   2. Left thyroid nodule previously 4.8 cm on the scan. This is on exam about the same in size Previously has been benign on biopsy No indication for doing ultrasound currently   She can also follow-up with her PCP regarding her splitting nails   Reather LittlerAjay Serafina Topham 08/21/2018, 8:15 AM

## 2018-08-28 DIAGNOSIS — L309 Dermatitis, unspecified: Secondary | ICD-10-CM | POA: Diagnosis not present

## 2018-08-28 DIAGNOSIS — J019 Acute sinusitis, unspecified: Secondary | ICD-10-CM | POA: Diagnosis not present

## 2018-08-28 MED FILL — CLOTRIMAZOLE-BETAMETHASONE: 1-0.05 | 14 days supply | Qty: 15 | Fill #0

## 2018-08-28 MED FILL — AZITHROMYCIN 250 MG TABLET: 250 | 6 days supply | Qty: 6 | Fill #0

## 2018-09-18 MED FILL — ALPRAZolam 0.5 MG TABS: 0.5 | 30 days supply | Qty: 30 | Fill #0

## 2018-09-29 MED FILL — HYDROCHLOROTHIAZIDE 25 MG T: 25 | 90 days supply | Qty: 90 | Fill #2

## 2018-11-02 ENCOUNTER — Other Ambulatory Visit: Payer: Self-pay

## 2018-11-02 ENCOUNTER — Other Ambulatory Visit (INDEPENDENT_AMBULATORY_CARE_PROVIDER_SITE_OTHER): Payer: BLUE CROSS/BLUE SHIELD

## 2018-11-02 DIAGNOSIS — E038 Other specified hypothyroidism: Secondary | ICD-10-CM

## 2018-11-02 LAB — T4, FREE: FREE T4: 1.2 ng/dL (ref 0.60–1.60)

## 2018-11-02 LAB — TSH

## 2018-11-04 ENCOUNTER — Other Ambulatory Visit: Payer: Self-pay

## 2018-11-04 ENCOUNTER — Encounter: Payer: Self-pay | Admitting: Endocrinology

## 2018-11-04 ENCOUNTER — Ambulatory Visit: Payer: BLUE CROSS/BLUE SHIELD | Admitting: Endocrinology

## 2018-11-04 VITALS — BP 122/80 | HR 88 | Ht 62.0 in | Wt 262.0 lb

## 2018-11-04 DIAGNOSIS — E038 Other specified hypothyroidism: Secondary | ICD-10-CM

## 2018-11-04 MED ORDER — LEVOTHYROXINE SODIUM 100 MCG PO TABS
100.0000 ug | ORAL_TABLET | Freq: Every day | ORAL | 3 refills | Status: DC
Start: 2018-11-04 — End: 2019-11-05

## 2018-11-04 MED FILL — LEVOTHYROXINE 100 MCG TAB: 100 | 90 days supply | Qty: 90 | Fill #0

## 2018-11-04 NOTE — Progress Notes (Addendum)
Patient ID: Joyce Watts, female   DOB: [birth year], 58 y.o.   MRN: [id number]   Reason for Appointment:  Secondary Hypothyroidism, follow-up visit    History of Present Illness:   She was seen in evaluation for secondary hypothyroidism in 04/2014 At that time she was having symptoms of fatigue, weight gain, memory difficulties, brittle nails and some hair loss; symptoms had started in 5/15  She was found to have a low free T4 of 0.59 by her PCP and referred here for further management. TSH previously was normal She was evaluated for pituitary hypofunction and all the labs were normal including appropriate level of  FSH Initially after starting supplementation patient had improved her energy level as well as less issues with concentration and memory.  Recent history:  She had been treated with levothyroxine 88 mcg daily for some time, had a dosage increase in 08/2018 With her dosage increase she had a slight improvement in her energy level  On her last visit also she was concerned about her nail splitting, and hair loss Also was complaining of some cold intolerance and difficulty focusing mentally  With taking an extra 88 levothyroxine once a week she thinks her nails and hair are better and she is not as cold Still has a little difficulty focusing mentally but not any alertness issues She has occasional mild fatigue which she can explain based on allergies  She is very regular with taking levothyroxine supplement before breakfast Free T4 is significantly better  TSH  Suppressed as before   Lab Results  Component Value Date   FREET4 1.20 11/02/2018   FREET4 0.78 08/18/2018   FREET4 0.88 02/20/2018   TSH <0.01 (L) 11/02/2018   TSH 0.45 08/18/2018   TSH 0.07 (L) 02/20/2018      Lab Results  Component Value Date   T3FREE 3.1 08/18/2018   T3FREE 3.1 02/20/2018   T3FREE 3.4 10/16/2017   T3FREE 3.0 05/21/2016   T3FREE 3.7 05/22/2015    Wt Readings from Last 3  Encounters:  11/04/18 262 lb (118.8 kg)  08/21/18 262 lb (118.8 kg)  02/24/18 257 lb 12.8 oz (116.9 kg)    PROBLEM 2:  Left thyroid nodule.   On her  initial exam in 2015 she was found to have a thyroid nodule on the left side and on ultrasound this was a solid nodule, measuring 4.8 cm She may have occasional difficulty swallowing which is not new She may feel a little fullness but no choking and this is not new  Thyroid biopsy showed benign follicular cells    Past Medical History:  Diagnosis Date  . Anxiety   . Carpal tunnel syndrome   . Depression   . Hyperlipidemia    LDL in 6/15 = 133  . Migraines    Past history    History reviewed. No pertinent surgical history.  Family History  Problem Relation Age of Onset  . Hypertension Father   . Diabetes Father   . Diabetes Maternal Grandfather   . Diabetes Paternal Grandfather   . Thyroid disease Neg Hx     Social History:  reports that she has never smoked. She has never used smokeless tobacco. No history on file for alcohol and drug.  Allergies:  Allergies  Allergen Reactions  . Sulfa Antibiotics Rash    Allergies as of 11/04/2018      Reactions   Sulfa Antibiotics Rash      Medication List  Accurate as of November 04, 2018  8:21 AM. Always use your most recent med list.        ALPRAZolam 0.5 MG tablet Commonly known as:  XANAX as needed.   buPROPion 300 MG 24 hr tablet Commonly known as:  WELLBUTRIN XL   hydrochlorothiazide 25 MG tablet Commonly known as:  HYDRODIURIL as needed.   levothyroxine 88 MCG tablet Commonly known as:  SYNTHROID, LEVOTHROID Take 1 tablet (88 mcg total) by mouth daily.   mupirocin cream 2 % Commonly known as:  BACTROBAN   rizatriptan 10 MG tablet Commonly known as:  MAXALT 10 mg as needed.   VENTOLIN HFA 108 (90 Base) MCG/ACT inhaler Generic drug:  albuterol as needed.       Review of Systems:  She is postmenopausal since about 2013.           She  takes HCTZ for edema   from her PCP     Examination:    BP 122/80 (BP Location: Left Arm, Patient Position: Sitting, Cuff Size: Normal)   Pulse 88   Ht 5\' 2"  (1.575 m)   Wt 262 lb (118.8 kg)   SpO2 96%   BMI 47.92 kg/m   Thyroid exam shows firm smooth nodule felt in the left lower lobe felt mostly on swallowing This is about 3-3.5 cm in size  Right lobe not palpable    Skin appears normal  Biceps reflexes show relatively normal relaxation  Assessments/Plan   1.  Secondary Hypothyroidism with baseline low free T4 level and symptoms of fatigue, weight gain, memory difficulties and  hair loss She has had fairly good control of symptoms with levothyroxine supplementation since about 2015  With increasing her dose by 88 mcg weekly she is feeling better than on her last visit However may have some attention deficit since she is says she cannot focus as well  Free T4 is relatively higher TSH is suppressed as before last June  She will now switch to the 100 mcg dosage and take 1 tablet daily  2. Left thyroid nodule previously 4.8 cm on the scan.  Stable on exam and no local pressure symptoms  She will follow-up with her PCP regarding attention deficit  Reather Littler 11/04/2018, 8:21 AM

## 2018-11-17 MED FILL — buPROPion HCL ER (XL) 300 M: 300 | 90 days supply | Qty: 90 | Fill #0

## 2018-12-09 MED FILL — buPROPion HCL ER (XL) 300 M: 300 | 90 days supply | Qty: 90 | Fill #0

## 2019-01-13 MED FILL — HYDROCHLOROTHIAZIDE 25 MG T: 25 | 90 days supply | Qty: 90 | Fill #0

## 2019-02-09 MED FILL — LEVOTHYROXINE 100 MCG TAB: 100 | 90 days supply | Qty: 90 | Fill #1

## 2019-03-05 MED FILL — ALPRAZolam 0.5 MG TABS: 0.5 | 10 days supply | Qty: 30 | Fill #0

## 2019-03-05 MED FILL — ALBUTEROL SULFATE HFA 108 (: 108 (90 BAS | 50 days supply | Qty: 9 | Fill #0

## 2019-05-03 ENCOUNTER — Other Ambulatory Visit: Payer: Self-pay

## 2019-05-03 ENCOUNTER — Other Ambulatory Visit (INDEPENDENT_AMBULATORY_CARE_PROVIDER_SITE_OTHER): Payer: BC Managed Care – PPO

## 2019-05-03 DIAGNOSIS — E038 Other specified hypothyroidism: Secondary | ICD-10-CM | POA: Diagnosis not present

## 2019-05-03 LAB — TSH: TSH: 0.04 u[IU]/mL — ABNORMAL LOW (ref 0.35–4.50)

## 2019-05-03 LAB — T4, FREE: Free T4: 1.22 ng/dL (ref 0.60–1.60)

## 2019-05-03 LAB — T3, FREE: T3, Free: 3.6 pg/mL (ref 2.3–4.2)

## 2019-05-07 ENCOUNTER — Encounter: Payer: Self-pay | Admitting: Endocrinology

## 2019-05-07 ENCOUNTER — Other Ambulatory Visit: Payer: Self-pay

## 2019-05-07 ENCOUNTER — Ambulatory Visit (INDEPENDENT_AMBULATORY_CARE_PROVIDER_SITE_OTHER): Payer: BC Managed Care – PPO | Admitting: Endocrinology

## 2019-05-07 DIAGNOSIS — E038 Other specified hypothyroidism: Secondary | ICD-10-CM

## 2019-05-07 NOTE — Progress Notes (Signed)
Patient ID: Joyce Watts, female   DOB: [birth year], 58 y.o.   MRN: [id number]  Today's office visit was provided via telemedicine using video technique The patient was explained the limitations of evaluation and management by telemedicine and the availability of in person appointments.  The patient understood the limitations and agreed to proceed. Patient also understood that the telehealth visit is billable. . Location of the patient: Patient's home . Location of the provider: Physician office Only the patient and myself were participating in the encounter    Reason for Appointment:  Secondary Hypothyroidism, follow-up visit    History of Present Illness:   She was seen in evaluation for secondary hypothyroidism in 04/2014 At that time she was having symptoms of fatigue, weight gain, memory difficulties, brittle nails and some hair loss; symptoms had started in 5/15  She was found to have a low free T4 of 0.59 by her PCP and referred here for further management. TSH previously was normal She was evaluated for pituitary hypofunction and all the labs were normal including appropriate level of  [place] Initially after starting supplementation patient had improved her energy level as well as less issues with concentration and memory.  Recent history:  In 12/19 she was concerned about her nail splitting, and hair loss Also was complaining of some cold intolerance and difficulty focusing mentally Her levothyroxine dosage was increased  Now taking 100 mcg daily consistently Currently not complaining of any skin or hair changes, difficulty focusing, unusual fatigue Does not have any of the original symptoms she had when she had hypothyroidism Also does not complain of any shakiness or palpitations  She is very regular with taking levothyroxine supplement before breakfast Free T4 is about the same at 1.22, previously as low as 7.8 before dosage increase Free T3 is very normal at  3.6  TSH significantly low as before   Lab Results  Component Value Date   FREET4 1.22 05/03/2019   FREET4 1.20 11/02/2018   FREET4 0.78 08/18/2018   TSH 0.04 (L) 05/03/2019   TSH <0.01 (L) 11/02/2018   TSH 0.45 08/18/2018      Lab Results  Component Value Date   T3FREE 3.6 05/03/2019   T3FREE 3.1 08/18/2018   T3FREE 3.1 02/20/2018   T3FREE 3.4 10/16/2017   T3FREE 3.0 05/21/2016    Wt Readings from Last 3 Encounters:  11/04/18 262 lb (118.8 kg)  08/21/18 262 lb (118.8 kg)  02/24/18 257 lb 12.8 oz (116.9 kg)    PROBLEM 2:  Left thyroid nodule.   On her  initial exam in 2015 she was found to have a thyroid nodule on the left side and on ultrasound this was a solid nodule, measuring 4.8 cm She may have occasional difficulty swallowing which is not new She may feel a little fullness but no choking and this is not new  Thyroid biopsy showed benign follicular cells    Past Medical History:  Diagnosis Date  . Anxiety   . Carpal tunnel syndrome   . Depression   . Hyperlipidemia    LDL in 6/15 = 133  . Migraines    Past history    No past surgical history on file.  Family History  Problem Relation Age of Onset  . Hypertension Father   . Diabetes Father   . Diabetes Maternal Grandfather   . Diabetes Paternal Grandfather   . Thyroid disease Neg Hx     Social History:  reports that she has never  smoked. She has never used smokeless tobacco. No history on file for alcohol and drug.  Allergies:  Allergies  Allergen Reactions  . Sulfa Antibiotics Rash    Allergies as of 05/07/2019      Reactions   Sulfa Antibiotics Rash      Medication List       Accurate as of May 07, 2019  8:12 AM. If you have any questions, ask your nurse or doctor.        ALPRAZolam 0.5 MG tablet Commonly known as: XANAX as needed.   buPROPion 300 MG 24 hr tablet Commonly known as: WELLBUTRIN XL   hydrochlorothiazide 25 MG tablet Commonly known as: HYDRODIURIL as  needed.   levothyroxine 100 MCG tablet Commonly known as: SYNTHROID Take 1 tablet (100 mcg total) by mouth daily.   mupirocin cream 2 % Commonly known as: BACTROBAN   rizatriptan 10 MG tablet Commonly known as: MAXALT 10 mg as needed.   Ventolin HFA 108 (90 Base) MCG/ACT inhaler Generic drug: albuterol as needed.       Review of Systems:  She is postmenopausal since about 2013.           She takes HCTZ for edema   from her PCP     Examination:    There were no vitals taken for this visit.    Assessments/Plan   1.  Secondary Hypothyroidism with baseline low free T4 level and symptoms of fatigue, weight gain, memory difficulties and  hair loss She has had resolution symptoms with levothyroxine supplementation since about 2015  She has now been on the 100 mcg levothyroxine tablet taking once a day  No recurrence of her symptoms and also no symptoms of excessive replacement  Free T4 is improved at 1.32 and stable Free T3 is slightly higher than before but still normal TSH is suppressed as before reflecting secondary hypothyroidism  She will now continue the 100 mcg dosage  2. Left thyroid nodule previously 4.8 cm on the scan.  She will need to be seen in the office in 6 months to reexamine her thyroid nodule, recently she has no increased pressure symptoms  Reather LittlerAjay Sherian Valenza 05/07/2019, 8:12 AM

## 2019-05-11 MED FILL — buPROPion HCL ER (XL) 300 M: 300 | 90 days supply | Qty: 90 | Fill #0

## 2019-05-11 MED FILL — HYDROCHLOROTHIAZIDE 25 MG T: 25 | 90 days supply | Qty: 90 | Fill #0

## 2019-05-17 MED FILL — LEVOTHYROXINE 100 MCG TAB: 100 | 90 days supply | Qty: 90 | Fill #2

## 2019-06-10 DIAGNOSIS — Z23 Encounter for immunization: Secondary | ICD-10-CM | POA: Diagnosis not present

## 2019-06-10 DIAGNOSIS — E7800 Pure hypercholesterolemia, unspecified: Secondary | ICD-10-CM | POA: Diagnosis not present

## 2019-06-10 DIAGNOSIS — Z6841 Body Mass Index (BMI) 40.0 and over, adult: Secondary | ICD-10-CM | POA: Diagnosis not present

## 2019-06-10 DIAGNOSIS — R030 Elevated blood-pressure reading, without diagnosis of hypertension: Secondary | ICD-10-CM | POA: Diagnosis not present

## 2019-06-10 DIAGNOSIS — F900 Attention-deficit hyperactivity disorder, predominantly inattentive type: Secondary | ICD-10-CM | POA: Diagnosis not present

## 2019-06-11 MED FILL — AMPHETAMINE-DEXTROAMPHETAMI: 10 | 30 days supply | Qty: 60 | Fill #0

## 2019-08-02 MED FILL — AZITHROMYCIN 250 MG TABLET: 250 | 5 days supply | Qty: 6 | Fill #0

## 2019-08-16 MED FILL — BUPROPION HCL XL 300 MG TAB: 300 | 30 days supply | Qty: 30 | Fill #1

## 2019-08-16 MED FILL — LEVOTHYROXINE SODIUM 100 MC: 100 | 90 days supply | Qty: 90 | Fill #3

## 2019-08-30 ENCOUNTER — Telehealth: Payer: Self-pay

## 2019-08-30 NOTE — Telephone Encounter (Signed)
LMTCB to schedule appt

## 2019-08-30 NOTE — Telephone Encounter (Signed)
-----   Message from Elayne Snare, MD sent at 08/29/2019  8:08 PM EST ----- Regarding: No follow-up appointment available She needs to be seen in March with labs for thyroid follow-up, please call

## 2019-09-09 MED FILL — HYDROCHLOROTHIAZIDE 25 MG T: 25 | 90 days supply | Qty: 90 | Fill #0

## 2019-09-09 MED FILL — BUPROPION HCL XL 300 MG TAB: 300 | 30 days supply | Qty: 30 | Fill #2

## 2019-09-16 NOTE — Telephone Encounter (Signed)
Scheduled patient for labs and f/u ov in March as requested

## 2019-10-11 MED FILL — BUPROPION HCL XL 300 MG TAB: 300 | 30 days supply | Qty: 30 | Fill #3

## 2019-10-29 ENCOUNTER — Other Ambulatory Visit: Payer: Self-pay

## 2019-10-29 ENCOUNTER — Other Ambulatory Visit (INDEPENDENT_AMBULATORY_CARE_PROVIDER_SITE_OTHER): Payer: BC Managed Care – PPO

## 2019-10-29 DIAGNOSIS — E038 Other specified hypothyroidism: Secondary | ICD-10-CM

## 2019-10-29 LAB — T4, FREE: Free T4: 0.93 ng/dL (ref 0.60–1.60)

## 2019-10-29 LAB — TSH: TSH: 0.06 u[IU]/mL — ABNORMAL LOW (ref 0.35–4.50)

## 2019-10-29 LAB — T3, FREE: T3, Free: 3.3 pg/mL (ref 2.3–4.2)

## 2019-11-02 ENCOUNTER — Other Ambulatory Visit: Payer: BC Managed Care – PPO

## 2019-11-05 ENCOUNTER — Encounter: Payer: Self-pay | Admitting: Endocrinology

## 2019-11-05 ENCOUNTER — Other Ambulatory Visit: Payer: Self-pay

## 2019-11-05 ENCOUNTER — Ambulatory Visit (INDEPENDENT_AMBULATORY_CARE_PROVIDER_SITE_OTHER): Payer: BC Managed Care – PPO | Admitting: Endocrinology

## 2019-11-05 VITALS — BP 154/84 | HR 98 | Ht 62.0 in | Wt 250.4 lb

## 2019-11-05 DIAGNOSIS — E038 Other specified hypothyroidism: Secondary | ICD-10-CM

## 2019-11-05 DIAGNOSIS — E041 Nontoxic single thyroid nodule: Secondary | ICD-10-CM | POA: Diagnosis not present

## 2019-11-05 MED ORDER — LEVOTHYROXINE SODIUM 112 MCG PO TABS: 112.0000 ug | ORAL_TABLET | Freq: Every day | ORAL | 3 refills | Status: DC

## 2019-11-05 MED FILL — LEVOTHYROXINE SODIUM 112 MC: 112 | 90 days supply | Qty: 90 | Fill #0

## 2019-11-05 NOTE — Progress Notes (Signed)
Patient ID: Joyce Watts, female   DOB: 1960-09-24, 59 y.o.   MRN: [id number]    Reason for Appointment:  Secondary Hypothyroidism, follow-up visit    History of Present Illness:   She was seen in evaluation for secondary hypothyroidism in 04/2014 At that time she was having symptoms of fatigue, weight gain, memory difficulties, brittle nails and some hair loss; symptoms had started in 5/15  She was found to have a low free T4 of 0.59 by her PCP and referred here for further management. TSH previously was normal She was evaluated for pituitary hypofunction and all the labs were normal including appropriate level of  [place] Initially after starting supplementation patient had improved her energy level as well as less issues with concentration and memory.  Recent history:  In 12/19 she was concerned about her nail splitting, and hair loss Also was complaining of some cold intolerance and difficulty focusing mentally Her levothyroxine dosage was increased  Now taking 100 mcg daily of generic levothyroxine, she thinks her manufacturer was changed by the pharmacy recently  She thinks that she has had a little more fatigue than usual but not clear since then Also complaining of cold feet and dry skin She takes Adderall to help her focus mentally better  She is very consistent with taking levothyroxine supplement before breakfast  Free T4 is lower than usual at 0.93 compared to 1.22 Free T3 is slightly lower than on her last visit in October  TSH still low as before    Lab Results  Component Value Date   FREET4 0.93 10/29/2019   FREET4 1.22 05/03/2019   FREET4 1.20 11/02/2018   TSH 0.06 (L) 10/29/2019   TSH 0.04 (L) 05/03/2019   TSH <0.01 (L) 11/02/2018      Lab Results  Component Value Date   T3FREE 3.3 10/29/2019   T3FREE 3.6 05/03/2019   T3FREE 3.1 08/18/2018   T3FREE 3.1 02/20/2018   T3FREE 3.4 10/16/2017    Wt Readings from Last 3 Encounters:  11/05/19  250 lb 6.4 oz (113.6 kg)  11/04/18 262 lb (118.8 kg)  08/21/18 262 lb (118.8 kg)    PROBLEM 2:  Left thyroid nodule.   On her  initial exam in 2015 she was found to have a thyroid nodule on the left side and on ultrasound this was a solid nodule, measuring 4.8 cm  She will have occasional difficulty swallowing which is not changed Also rarely may feel a little choking as before   Thyroid biopsy showed benign follicular cells    Past Medical History:  Diagnosis Date  . Anxiety   . Carpal tunnel syndrome   . Depression   . Hyperlipidemia    LDL in 6/15 = 133  . Migraines    Past history    History reviewed. No pertinent surgical history.  Family History  Problem Relation Age of Onset  . Hypertension Father   . Diabetes Father   . Diabetes Maternal Grandfather   . Diabetes Paternal Grandfather   . Thyroid disease Neg Hx     Social History:  reports that she has never smoked. She has never used smokeless tobacco. No history on file for alcohol and drug.  Allergies:  Allergies  Allergen Reactions  . Sulfa Antibiotics Rash    Allergies as of 11/05/2019      Reactions   Sulfa Antibiotics Rash      Medication List       Accurate as of November 05, 2019  8:15 AM. If you have any questions, ask your nurse or doctor.        STOP taking these medications   ALPRAZolam 0.5 MG tablet Commonly known as: XANAX Stopped by: Reather Littler, MD     TAKE these medications   buPROPion 300 MG 24 hr tablet Commonly known as: WELLBUTRIN XL   hydrochlorothiazide 25 MG tablet Commonly known as: HYDRODIURIL as needed.   levothyroxine 100 MCG tablet Commonly known as: SYNTHROID Take 1 tablet (100 mcg total) by mouth daily.   mupirocin cream 2 % Commonly known as: BACTROBAN   rizatriptan 10 MG tablet Commonly known as: MAXALT 10 mg as needed.   Ventolin HFA 108 (90 Base) MCG/ACT inhaler Generic drug: albuterol as needed.       Review of Systems:  She is  postmenopausal since about 2013.           She takes HCTZ for edema   from her PCP     Examination:    BP (!) 154/84 (BP Location: Left Arm, Patient Position: Sitting, Cuff Size: Normal)   Pulse 98   Ht 5\' 2"  (1.575 m)   Wt 250 lb 6.4 oz (113.6 kg)   SpO2 98%   BMI 45.80 kg/m   Thyroid nodule is palpable on the left side and measures about 3.5 cm , this is firm and smooth No enlargement of the right lobe or isthmus palpable Lymphadenopathy not present Biceps reflexes appear normal  Assessments/Plan   1.  Secondary Hypothyroidism with baseline low free T4 level and symptoms of fatigue, weight gain, memory difficulties and  hair loss She has had control of her symptoms with levothyroxine supplementation since about 2015  She has been on the 100 mcg levothyroxine supplement for some time  She does appear to have some fatigue although this is nonspecific and also some dry skin and cold feet  Free T4 is lower than usual at 0.93, previously was 1.22  TSH is suppressed as before reflecting secondary hypothyroidism  Since she does have some fatigue may be able to benefit from increasing the dose slightly to 112 mcg New prescription sent  2. Left thyroid nodule previously 4.8 cm on the scan and benign on biopsy.  This is clinically measuring about the same on exam as about a year ago  Follow-up in 3 months  Kely Dohn 11/05/2019, 8:15 AM

## 2019-11-15 MED FILL — buPROPion HCL ER (XL) 300 M: 300 | 90 days supply | Qty: 90 | Fill #0

## 2019-11-19 ENCOUNTER — Ambulatory Visit: Payer: BC Managed Care – PPO | Attending: Internal Medicine

## 2019-11-19 DIAGNOSIS — Z23 Encounter for immunization: Secondary | ICD-10-CM

## 2019-11-19 NOTE — Progress Notes (Signed)
   Covid-19 Vaccination Clinic  Name:  Joyce Watts    MRN: [id number] DOB: Apr 21, 1961  11/19/2019  Joyce Watts was observed post Covid-19 immunization for 15 minutes without incident. She was provided with Vaccine Information Sheet and instruction to access the V-Safe system.   Joyce Watts was instructed to call 911 with any severe reactions post vaccine: Marland Kitchen Difficulty breathing  . Swelling of face and throat  . A fast heartbeat  . A bad rash all over body  . Dizziness and weakness   Immunizations Administered    Name Date Dose VIS Date Route   Pfizer COVID-19 Vaccine 11/19/2019  8:42 AM 0.3 mL 08/13/2019 Intramuscular   Manufacturer: [employer], [employer]   Lot: [id number]   NDC: [address]-2

## 2019-11-24 ENCOUNTER — Other Ambulatory Visit: Payer: Self-pay

## 2019-11-24 ENCOUNTER — Other Ambulatory Visit: Payer: Self-pay | Admitting: Endocrinology

## 2019-11-24 MED ORDER — LEVOTHYROXINE SODIUM 112 MCG PO TABS: 112.0000 ug | ORAL_TABLET | Freq: Every day | ORAL | 3 refills | Status: DC

## 2019-11-24 MED FILL — LEVOTHYROXINE SODIUM 112 MC: 112 | 90 days supply | Qty: 90 | Fill #0

## 2019-12-09 DIAGNOSIS — G43909 Migraine, unspecified, not intractable, without status migrainosus: Secondary | ICD-10-CM | POA: Diagnosis not present

## 2019-12-09 DIAGNOSIS — F419 Anxiety disorder, unspecified: Secondary | ICD-10-CM | POA: Diagnosis not present

## 2019-12-09 DIAGNOSIS — L309 Dermatitis, unspecified: Secondary | ICD-10-CM | POA: Diagnosis not present

## 2019-12-09 DIAGNOSIS — F900 Attention-deficit hyperactivity disorder, predominantly inattentive type: Secondary | ICD-10-CM | POA: Diagnosis not present

## 2019-12-09 MED FILL — TRIAMCINOLONE ACETONIDE 0.1: 0.1 | 14 days supply | Qty: 30 | Fill #0

## 2019-12-15 ENCOUNTER — Ambulatory Visit: Payer: BC Managed Care – PPO | Attending: Internal Medicine

## 2019-12-15 DIAGNOSIS — Z23 Encounter for immunization: Secondary | ICD-10-CM

## 2019-12-15 NOTE — Progress Notes (Signed)
   Covid-19 Vaccination Clinic  Name:  Joyce Watts    MRN: [id number] DOB: 01/13/1961  12/15/2019  Ms. Joyce Watts was observed post Covid-19 immunization for 15 minutes without incident. She was provided with Vaccine Information Sheet and instruction to access the V-Safe system.   Ms. Joyce Watts was instructed to call 911 with any severe reactions post vaccine: Marland Kitchen Difficulty breathing  . Swelling of face and throat  . A fast heartbeat  . A bad rash all over body  . Dizziness and weakness   Immunizations Administered    Name Date Dose VIS Date Route   Pfizer COVID-19 Vaccine 12/15/2019  8:09 AM 0.3 mL 08/13/2019 Intramuscular   Manufacturer: [employer], [employer]   Lot: [id number]   NDC: [address]-0

## 2020-01-24 MED FILL — HYDROCHLOROTHIAZIDE 25 MG T: 25 | 90 days supply | Qty: 90 | Fill #1

## 2020-01-25 MED FILL — ALPRAZolam 0.5 MG TABS: 0.5 | 10 days supply | Qty: 30 | Fill #0

## 2020-02-07 ENCOUNTER — Other Ambulatory Visit: Payer: Self-pay

## 2020-02-07 ENCOUNTER — Other Ambulatory Visit (INDEPENDENT_AMBULATORY_CARE_PROVIDER_SITE_OTHER): Payer: BC Managed Care – PPO

## 2020-02-07 DIAGNOSIS — E038 Other specified hypothyroidism: Secondary | ICD-10-CM | POA: Diagnosis not present

## 2020-02-07 LAB — TSH: TSH: 0.04 u[IU]/mL — ABNORMAL LOW (ref 0.35–4.50)

## 2020-02-07 LAB — T4, FREE: Free T4: 1.09 ng/dL (ref 0.60–1.60)

## 2020-02-09 NOTE — Progress Notes (Signed)
Patient ID: Joyce Watts, female   DOB: [birth year], 59 y.o.   MRN: [id number]    Reason for Appointment: Follow-up of thyroid    History of Present Illness:   She was seen in evaluation for secondary hypothyroidism in 04/2014 At that time she was having symptoms of fatigue, weight gain, memory difficulties, brittle nails and some hair loss; symptoms had started in 5/15  She was found to have a low free T4 of 0.59 by her PCP and referred here for further management. TSH previously was normal She was evaluated for pituitary hypofunction and all the labs were normal including appropriate level of  [place] Initially after starting supplementation patient had improved her energy level as well as less issues with concentration and memory.  Recent history:  In 12/19 she was concerned about her nail splitting, and hair loss Also was complaining of some cold intolerance and difficulty focusing mentally Subsequently in 3/21 she was complaining of more fatigue than usual which was nonspecific She tends to have chronic coldness of her feet and some dry skin  Now taking 1000 mcg of levothyroxine since 3/21 With this she thinks she is getting less tired and is able to stay up later in the evenings, still has some fatigue because of long working hours No change in skin or hair She takes Adderall to help her focus mentally better  She is very consistent with taking levothyroxine supplement before breakfast  Free T4 is  than usual at 0.93 compared to 1.22 Free T3 is slightly lower than on her last visit in October  TSH still low as before    Lab Results  Component Value Date   FREET4 1.09 02/07/2020   FREET4 0.93 10/29/2019   FREET4 1.22 05/03/2019   TSH 0.04 (L) 02/07/2020   TSH 0.06 (L) 10/29/2019   TSH 0.04 (L) 05/03/2019      Lab Results  Component Value Date   T3FREE 3.3 10/29/2019   T3FREE 3.6 05/03/2019   T3FREE 3.1 08/18/2018   T3FREE 3.1 02/20/2018   T3FREE 3.4  10/16/2017    Wt Readings from Last 3 Encounters:  02/10/20 251 lb (113.9 kg)  11/05/19 250 lb 6.4 oz (113.6 kg)  11/04/18 262 lb (118.8 kg)    PROBLEM 2:  Left thyroid nodule.   On her  initial exam in 2015 she was found to have a thyroid nodule on the left side and on ultrasound this was a solid nodule, measuring 4.8 cm  She will have occasional difficulty swallowing which is not changed Also rarely may feel a little choking as before   Thyroid biopsy showed benign follicular cells    Past Medical History:  Diagnosis Date  . Anxiety   . Carpal tunnel syndrome   . Depression   . Hyperlipidemia    LDL in 6/15 = 133  . Migraines    Past history    History reviewed. No pertinent surgical history.  Family History  Problem Relation Age of Onset  . Hypertension Father   . Diabetes Father   . Diabetes Maternal Grandfather   . Diabetes Paternal Grandfather   . Thyroid disease Neg Hx     Social History:  reports that she has never smoked. She has never used smokeless tobacco. No history on file for alcohol use and drug use.  Allergies:  Allergies  Allergen Reactions  . Sulfa Antibiotics Rash    Allergies as of 02/10/2020      Reactions  Sulfa Antibiotics Rash      Medication List       Accurate as of February 10, 2020  8:18 AM. If you have any questions, ask your nurse or doctor.        Adderall 5 MG tablet Generic drug: amphetamine-dextroamphetamine Take 5 mg by mouth daily as needed. Take 1-2 tablets (5-10mg  total) by mouth once daily.   ALPRAZolam 0.5 MG tablet Commonly known as: XANAX Take 0.5 mg by mouth daily as needed for anxiety. Take 1 tablet by mouth once daily as needed.   buPROPion 300 MG 24 hr tablet Commonly known as: WELLBUTRIN XL Take 300 mg by mouth daily. Take 1 tablet by mouth once daily.   hydrochlorothiazide 25 MG tablet Commonly known as: HYDRODIURIL Take 25 mg by mouth daily as needed. Take 1 tablet by mouth once daily as  needed.   levothyroxine 112 MCG tablet Commonly known as: SYNTHROID Take 1 tablet (112 mcg total) by mouth daily.   mupirocin ointment 2 % Commonly known as: BACTROBAN Apply 1 application topically 3 times/day as needed-between meals & bedtime.   rizatriptan 10 MG tablet Commonly known as: MAXALT Take 10 mg by mouth daily as needed for migraine. Take 1 tablet by mouth once daily as needed.   Ventolin HFA 108 (90 Base) MCG/ACT inhaler Generic drug: albuterol as needed.       Review of Systems:  She is postmenopausal since about 2013.           She takes HCTZ for edema   from her PCP    She has gradually had some difficulty swallowing and sometimes food seems to get stuck in her throat.  This may get better when she raises her arms.  Has not discussed with PCP  Has only mild menopausal symptoms   Examination:    BP 138/72 (BP Location: Left Arm, Patient Position: Sitting, Cuff Size: Large)   Pulse 89   Ht 5\' 2"  (1.575 m)   Wt 251 lb (113.9 kg)   SpO2 99%   BMI 45.91 kg/m   Thyroid nodule is palpable on the left side smooth and firm, better felt on swallowing, 3-3.5 cm Right lobe is prominent, about 2.5-3 cm relatively flat nodule felt  Biceps reflexes difficult to elicit Skin appears normal   Assessments/Plan   1.  Secondary Hypothyroidism with baseline low free T4 level and symptoms of fatigue, weight gain, memory difficulties and  hair loss She has had control of her symptoms with levothyroxine supplementation since about 2015  She has been on the 100 mcg levothyroxine supplement since March since she had previously some fatigue This appears to be improved overall Did not have any other symptoms of hypothyroidism Since her free T4 is improving and she is subjectively doing better she can continue 112 mcg We'll recheck in 6 months  2. Left thyroid nodule previously 4.8 cm on the scan and benign on biopsy.  Left nodule is unchanged on exam but right lobe is  more prominent, possibly a new nodule  Repeat ultrasound especially since she has some difficulty swallowing although this is somewhat atypical Previous ultrasound in 2015  2016 02/10/2020, 8:18 AM

## 2020-02-10 ENCOUNTER — Encounter: Payer: Self-pay | Admitting: Endocrinology

## 2020-02-10 ENCOUNTER — Other Ambulatory Visit: Payer: Self-pay

## 2020-02-10 ENCOUNTER — Ambulatory Visit (INDEPENDENT_AMBULATORY_CARE_PROVIDER_SITE_OTHER): Payer: BC Managed Care – PPO | Admitting: Endocrinology

## 2020-02-10 VITALS — BP 138/72 | HR 89 | Ht 62.0 in | Wt 251.0 lb

## 2020-02-10 DIAGNOSIS — R1312 Dysphagia, oropharyngeal phase: Secondary | ICD-10-CM

## 2020-02-10 DIAGNOSIS — E038 Other specified hypothyroidism: Secondary | ICD-10-CM

## 2020-02-10 DIAGNOSIS — E041 Nontoxic single thyroid nodule: Secondary | ICD-10-CM

## 2020-02-14 MED FILL — ALPRAZolam 0.5 MG TABS: 0.5 | 10 days supply | Qty: 30 | Fill #0

## 2020-02-16 MED FILL — buPROPion HCL ER (XL) 300 M: 300 | 90 days supply | Qty: 90 | Fill #0

## 2020-02-21 ENCOUNTER — Ambulatory Visit
Admission: RE | Admit: 2020-02-21 | Discharge: 2020-02-21 | Disposition: A | Discharge status: Home / Self Care | Payer: BC Managed Care – PPO | Source: Ambulatory Visit | Attending: Endocrinology | Admitting: Endocrinology

## 2020-02-21 DIAGNOSIS — R1312 Dysphagia, oropharyngeal phase: Secondary | ICD-10-CM

## 2020-02-21 DIAGNOSIS — E041 Nontoxic single thyroid nodule: Secondary | ICD-10-CM | POA: Diagnosis not present

## 2020-02-22 NOTE — Progress Notes (Signed)
Please call to let patient know that the ultrasound shows the nodule to be unchanged and no further action needed

## 2020-03-06 MED FILL — LEVOTHYROXINE SODIUM 112 MC: 112 | 90 days supply | Qty: 90 | Fill #1

## 2020-03-08 MED FILL — AMPHETAMINE SALTS 10 MG: 10 | 30 days supply | Qty: 60 | Fill #0

## 2020-04-10 MED FILL — RIZATRIPTAN BENZOATE 10 MG: 10 | 90 days supply | Qty: 27 | Fill #0

## 2020-04-10 MED FILL — ALBUTEROL SULFATE HFA 108 (: 108 (90 BAS | 50 days supply | Qty: 9 | Fill #0

## 2020-05-15 MED FILL — buPROPion HCL ER (XL) 300 M: 300 | 90 days supply | Qty: 90 | Fill #1

## 2020-06-06 MED FILL — LEVOTHYROXINE SODIUM 112 MC: 112 | 90 days supply | Qty: 90 | Fill #2

## 2020-06-12 ENCOUNTER — Other Ambulatory Visit (HOSPITAL_COMMUNITY): Payer: Self-pay | Admitting: Family Medicine

## 2020-06-12 DIAGNOSIS — Z23 Encounter for immunization: Secondary | ICD-10-CM | POA: Diagnosis not present

## 2020-06-12 DIAGNOSIS — E7800 Pure hypercholesterolemia, unspecified: Secondary | ICD-10-CM | POA: Diagnosis not present

## 2020-06-12 DIAGNOSIS — J0100 Acute maxillary sinusitis, unspecified: Secondary | ICD-10-CM | POA: Diagnosis not present

## 2020-06-12 DIAGNOSIS — F419 Anxiety disorder, unspecified: Secondary | ICD-10-CM | POA: Diagnosis not present

## 2020-06-12 DIAGNOSIS — F900 Attention-deficit hyperactivity disorder, predominantly inattentive type: Secondary | ICD-10-CM | POA: Diagnosis not present

## 2020-06-12 MED FILL — AZITHROMYCIN 250 MG TABLET: 250 | 5 days supply | Qty: 6 | Fill #0

## 2020-06-15 ENCOUNTER — Other Ambulatory Visit: Payer: Self-pay | Admitting: Family Medicine

## 2020-06-15 DIAGNOSIS — Z1231 Encounter for screening mammogram for malignant neoplasm of breast: Secondary | ICD-10-CM

## 2020-06-16 ENCOUNTER — Ambulatory Visit
Admission: RE | Admit: 2020-06-16 | Discharge: 2020-06-16 | Disposition: A | Discharge status: Home / Self Care | Payer: BC Managed Care – PPO | Source: Ambulatory Visit

## 2020-06-16 ENCOUNTER — Other Ambulatory Visit: Payer: Self-pay

## 2020-06-16 DIAGNOSIS — Z1231 Encounter for screening mammogram for malignant neoplasm of breast: Secondary | ICD-10-CM | POA: Diagnosis not present

## 2020-06-20 DIAGNOSIS — Z01419 Encounter for gynecological examination (general) (routine) without abnormal findings: Secondary | ICD-10-CM | POA: Diagnosis not present

## 2020-06-20 DIAGNOSIS — Z6841 Body Mass Index (BMI) 40.0 and over, adult: Secondary | ICD-10-CM | POA: Diagnosis not present

## 2020-06-20 DIAGNOSIS — Z1151 Encounter for screening for human papillomavirus (HPV): Secondary | ICD-10-CM | POA: Diagnosis not present

## 2020-08-01 DIAGNOSIS — M170 Bilateral primary osteoarthritis of knee: Secondary | ICD-10-CM | POA: Diagnosis not present

## 2020-08-04 ENCOUNTER — Other Ambulatory Visit: Payer: Self-pay

## 2020-08-04 ENCOUNTER — Other Ambulatory Visit (INDEPENDENT_AMBULATORY_CARE_PROVIDER_SITE_OTHER): Payer: BC Managed Care – PPO

## 2020-08-04 DIAGNOSIS — E038 Other specified hypothyroidism: Secondary | ICD-10-CM | POA: Diagnosis not present

## 2020-08-04 LAB — TSH: TSH: <0.01 u[IU]/mL — ABNORMAL LOW (ref 0.35–4.50)

## 2020-08-04 LAB — T4, FREE: Free T4: 1.43 ng/dL (ref 0.60–1.60)

## 2020-08-04 LAB — T3, FREE: T3, Free: 3.9 pg/mL (ref 2.3–4.2)

## 2020-08-07 ENCOUNTER — Other Ambulatory Visit (HOSPITAL_COMMUNITY): Payer: Self-pay | Admitting: Family Medicine

## 2020-08-07 MED FILL — buPROPion HCL ER (XL) 300 M: 300 | 90 days supply | Qty: 90 | Fill #0

## 2020-08-08 DIAGNOSIS — M170 Bilateral primary osteoarthritis of knee: Secondary | ICD-10-CM | POA: Diagnosis not present

## 2020-08-10 MED FILL — ALPRAZolam 0.5 MG TABS: 0.5 | 10 days supply | Qty: 30 | Fill #0

## 2020-08-11 ENCOUNTER — Encounter: Payer: Self-pay | Admitting: Endocrinology

## 2020-08-11 ENCOUNTER — Other Ambulatory Visit: Payer: Self-pay

## 2020-08-11 ENCOUNTER — Ambulatory Visit (INDEPENDENT_AMBULATORY_CARE_PROVIDER_SITE_OTHER): Payer: BC Managed Care – PPO | Admitting: Endocrinology

## 2020-08-11 ENCOUNTER — Other Ambulatory Visit: Payer: Self-pay | Admitting: Endocrinology

## 2020-08-11 VITALS — BP 140/90 | HR 110 | Temp 98.4°F | Ht 62.0 in | Wt 242.0 lb

## 2020-08-11 DIAGNOSIS — E038 Other specified hypothyroidism: Secondary | ICD-10-CM

## 2020-08-11 DIAGNOSIS — E041 Nontoxic single thyroid nodule: Secondary | ICD-10-CM

## 2020-08-11 MED ORDER — LEVOTHYROXINE SODIUM 88 MCG PO TABS: 88.0000 ug | ORAL_TABLET | Freq: Every day | ORAL | 3 refills | Status: DC

## 2020-08-11 MED FILL — LEVOTHYROXINE 88 MCG TABLET: 88 | 30 days supply | Qty: 30 | Fill #0

## 2020-08-11 NOTE — Progress Notes (Signed)
Patient ID: Joyce Watts, female   DOB: 1961/08/07, 59 y.o.   MRN: [id number]    Reason for Appointment: Follow-up of thyroid    History of Present Illness:   She was seen in evaluation for secondary hypothyroidism in 04/2014 At that time she was having symptoms of fatigue, weight gain, memory difficulties, brittle nails and some hair loss; symptoms had started in 5/15  She was found to have a low free T4 of 0.59 by her PCP and referred here for further management. TSH previously was normal She was evaluated for pituitary hypofunction and all the labs were normal including appropriate level of  FSH Initially after starting supplementation patient had improved her energy level as well as less issues with concentration and memory.  Recent history:  In 12/19 she was concerned about her nail splitting, and hair loss Also was complaining of some cold intolerance and difficulty focusing mentally Subsequently in 3/21 she was complaining of more fatigue than usual which was nonspecific She tends to have chronic coldness of her feet and some dry skin  Now taking 112 mcg of levothyroxine since 3/21 With this she was less tired  More recently she has not had any unusual fatigue although has had some issues with anxiety and stress No palpitations or shakiness Occasionally may feel warm She takes Adderall to help her focus mentally better  She is very consistent with taking levothyroxine supplement before breakfast  Free T4 is higher than usual at 1.4 and free T3 is upper normal along with suppressed TSH now   Lab Results  Component Value Date   FREET4 1.43 08/04/2020   FREET4 1.09 02/07/2020   FREET4 0.93 10/29/2019   TSH <0.01 (L) 08/04/2020   TSH 0.04 (L) 02/07/2020   TSH 0.06 (L) 10/29/2019      Lab Results  Component Value Date   T3FREE 3.9 08/04/2020   T3FREE 3.3 10/29/2019   T3FREE 3.6 05/03/2019   T3FREE 3.1 08/18/2018   T3FREE 3.1 02/20/2018    Wt  Readings from Last 3 Encounters:  08/11/20 242 lb (109.8 kg)  02/10/20 251 lb (113.9 kg)  11/05/19 250 lb 6.4 oz (113.6 kg)    PROBLEM 2:  Left thyroid nodule.   On her  initial exam in 2015 she was found to have a thyroid nodule on the left side and on ultrasound this was a solid nodule, measuring 4.8 cm  She will have occasional difficulty swallowing which is not changed Also rarely may feel a little choking as before  02/2020 ultrasound report: Nodule within the left thyroid is essentially unchanged in size from the comparison ultrasound in 2015  Thyroid biopsy showed benign follicular cells    Past Medical History:  Diagnosis Date  . Anxiety   . Carpal tunnel syndrome   . Depression   . Hyperlipidemia    LDL in 6/15 = 133  . Migraines    Past history    History reviewed. No pertinent surgical history.  Family History  Problem Relation Age of Onset  . Hypertension Father   . Diabetes Father   . Diabetes Maternal Grandfather   . Diabetes Paternal Grandfather   . Thyroid disease Neg Hx     Social History:  reports that she has never smoked. She has never used smokeless tobacco. No history on file for alcohol use and drug use.  Allergies:  Allergies  Allergen Reactions  . Sulfa Antibiotics Rash    Allergies as of 08/11/2020  Reactions   Sulfa Antibiotics Rash      Medication List       Accurate as of August 11, 2020  8:12 AM. If you have any questions, ask your nurse or doctor.        ALPRAZolam 0.5 MG tablet Commonly known as: XANAX Take 0.5 mg by mouth daily as needed for anxiety. Take 1 tablet by mouth once daily as needed.   amphetamine-dextroamphetamine 5 MG tablet Commonly known as: ADDERALL Take 5 mg by mouth daily as needed. Take 1-2 tablets (5-10mg  total) by mouth once daily.   azithromycin 250 MG tablet Commonly known as: ZITHROMAX azithromycin 250 mg tablet  TAKE 2 TABLETS BY MOUTH ON DAY 1 THEN TAKE 1 TABLET DAILY FOR THE  NEXT 4 DAYS   buPROPion 300 MG 24 hr tablet Commonly known as: WELLBUTRIN XL Take 300 mg by mouth daily. Take 1 tablet by mouth once daily.   hydrochlorothiazide 25 MG tablet Commonly known as: HYDRODIURIL Take 25 mg by mouth daily as needed. Take 1 tablet by mouth once daily as needed.   levothyroxine 112 MCG tablet Commonly known as: SYNTHROID Take 1 tablet (112 mcg total) by mouth daily.   mupirocin ointment 2 % Commonly known as: BACTROBAN Apply 1 application topically 3 times/day as needed-between meals & bedtime.   rizatriptan 10 MG tablet Commonly known as: MAXALT Take 10 mg by mouth daily as needed for migraine. Take 1 tablet by mouth once daily as needed.   triamcinolone 0.1 % Commonly known as: KENALOG triamcinolone acetonide 0.1 % topical cream  APPLY TOPICALLY TO THE AFFECTED AREA(S) TWICE DAILY   Ventolin HFA 108 (90 Base) MCG/ACT inhaler Generic drug: albuterol as needed.       Review of Systems:  She is postmenopausal since about 2013.           She takes HCTZ for edema as needed  from her PCP   No history of hypertension  She has occasional difficulty swallowing and sometimes food  seems to get stuck in her throat.  She still has not discussed this with PCP    Examination:    BP 140/90 (BP Location: Left Arm, Patient Position: Sitting, Cuff Size: Large)   Pulse (!) 110   Temp 98.4 F (36.9 C) (Oral)   Ht 5\' 2"  (1.575 m)   Wt 242 lb (109.8 kg)   SpO2 96%   BMI 44.26 kg/m   Thyroid nodule on the left lobe is smooth and firm, mostly felt on swallowing, about 3.5 cm in size Right lobe is not clearly palpable No nodule felt on the right  Biceps reflexes show normal relaxation No tremor   Assessments/Plan   1.  Secondary Hypothyroidism with baseline low free T4 level and symptoms of fatigue, weight gain, memory difficulties and  hair loss She has had control of her symptoms with levothyroxine supplementation since about 2015  She has  been on the 112 mcg levothyroxine supplement since March with subsequent improvement in energy level  She does have some nonspecific anxiety and heat intolerance Also has lost some weight She has a relatively fast pulse today but also has had some stress and insomnia  Since her free T4 is higher than usual at 1.4 we will reduce her dose back down to 88 mcg levothyroxine  2. Left thyroid nodule, 4.8 cm with stable ultrasound and benign on biopsy.  Exam is about the same as before today  Repeat ultrasound in June 21 was showing  stable size of the nodule and no new nodules  Anxiety: If she is still having symptoms after reducing her thyroid supplement will have her see the PCP Also if she continues to have difficulty swallowing she needs to have this evaluated also by her PCP  Reather Littler 08/11/2020, 8:12 AM

## 2020-08-15 DIAGNOSIS — M170 Bilateral primary osteoarthritis of knee: Secondary | ICD-10-CM | POA: Diagnosis not present

## 2020-08-18 MED FILL — ALPRAZolam 0.5 MG TABS: 0.5 | 30 days supply | Qty: 60 | Fill #0

## 2020-09-20 ENCOUNTER — Other Ambulatory Visit: Payer: Self-pay

## 2020-09-21 ENCOUNTER — Other Ambulatory Visit (INDEPENDENT_AMBULATORY_CARE_PROVIDER_SITE_OTHER): Payer: BC Managed Care – PPO

## 2020-09-21 DIAGNOSIS — E038 Other specified hypothyroidism: Secondary | ICD-10-CM

## 2020-09-21 LAB — T4, FREE: Free T4: 1.06 ng/dL (ref 0.60–1.60)

## 2020-09-21 LAB — T3, FREE: T3, Free: 3.8 pg/mL (ref 2.3–4.2)

## 2020-09-21 LAB — TSH: TSH: <0.01 u[IU]/mL — ABNORMAL LOW (ref 0.35–4.50)

## 2020-09-27 ENCOUNTER — Other Ambulatory Visit: Payer: Self-pay

## 2020-09-27 ENCOUNTER — Encounter: Payer: Self-pay | Admitting: Endocrinology

## 2020-09-27 ENCOUNTER — Ambulatory Visit (INDEPENDENT_AMBULATORY_CARE_PROVIDER_SITE_OTHER): Payer: BC Managed Care – PPO | Admitting: Endocrinology

## 2020-09-27 VITALS — BP 136/84 | HR 99 | Ht 62.0 in | Wt 237.4 lb

## 2020-09-27 DIAGNOSIS — E038 Other specified hypothyroidism: Secondary | ICD-10-CM | POA: Diagnosis not present

## 2020-09-27 NOTE — Progress Notes (Signed)
Patient ID: Joyce Watts, female   DOB: 1961/07/22, 60 y.o.   MRN: [id number]    Reason for Appointment: Follow-up of thyroid    History of Present Illness:   She was seen in evaluation for secondary hypothyroidism in 04/2014 At that time she was having symptoms of fatigue, weight gain, memory difficulties, brittle nails and some hair loss; symptoms had started in 5/15  She was found to have a low free T4 of 0.59 by her PCP and referred here for further management. TSH previously was normal She was evaluated for pituitary hypofunction and all the labs were normal including appropriate level of  FSH Initially after starting supplementation patient had improved her energy level as well as less issues with concentration and memory.  Recent history:  In 12/19 she was concerned about her nail splitting, and hair loss Also was complaining of some cold intolerance and difficulty focusing mentally Subsequently in 3/21 she was complaining of more fatigue than usual which was nonspecific She tends to have chronic coldness of her feet and some dry skin  She was taking 112 mcg of levothyroxine since 3/21 12/21 visit: She was having more symptoms of anxiety and feeling warm but no palpitations  Since her free T4 was higher than usual at 1.4 she was changed back to 88 mcg levothyroxine She is not sure if she feels any different with this Still has occasional times when her pulse is faster as checked on her watch but also may be down in the 60s at times also Weight has come down further but she thinks she is eating less  She takes Adderall to help her focus mentally better  She is very regular with taking levothyroxine supplement before breakfast More recently she ran out of her levothyroxine 88 and is taking about 6 tablets a week of the 112 mcg on her own  Free T4 is back down to 1.1  Free T3 about the same Again has suppressed TSH    Lab Results  Component Value Date    FREET4 1.06 09/21/2020   FREET4 1.43 08/04/2020   FREET4 1.09 02/07/2020   TSH <0.01 (L) 09/21/2020   TSH <0.01 (L) 08/04/2020   TSH 0.04 (L) 02/07/2020      Lab Results  Component Value Date   T3FREE 3.8 09/21/2020   T3FREE 3.9 08/04/2020   T3FREE 3.3 10/29/2019   T3FREE 3.6 05/03/2019   T3FREE 3.1 08/18/2018    Wt Readings from Last 3 Encounters:  09/27/20 237 lb 6.4 oz (107.7 kg)  08/11/20 242 lb (109.8 kg)  02/10/20 251 lb (113.9 kg)    PROBLEM 2:  Left thyroid nodule.   On her  initial exam in 2015 she was found to have a thyroid nodule on the left side and on ultrasound this was a solid nodule, measuring 4.8 cm  She will have occasional difficulty swallowing which is not changed is a feeling of food stopping the upper chest Also rarely may feel a little choking as before  02/2020 ultrasound report: Nodule within the left thyroid is essentially unchanged in size from the comparison ultrasound in 2015  Thyroid biopsy showed benign follicular cells    Past Medical History:  Diagnosis Date  . Anxiety   . Carpal tunnel syndrome   . Depression   . Hyperlipidemia    LDL in 6/15 = 133  . Migraines    Past history    No past surgical history on file.  Family History  Problem Relation Age of Onset  . Hypertension Father   . Diabetes Father   . Diabetes Maternal Grandfather   . Diabetes Paternal Grandfather   . Thyroid disease Neg Hx     Social History:  reports that she has never smoked. She has never used smokeless tobacco. No history on file for alcohol use and drug use.  Allergies:  Allergies  Allergen Reactions  . Sulfa Antibiotics Rash    Allergies as of 09/27/2020      Reactions   Sulfa Antibiotics Rash      Medication List       Accurate as of September 27, 2020  9:19 AM. If you have any questions, ask your nurse or doctor.        ALPRAZolam 0.5 MG tablet Commonly known as: XANAX Take 0.5 mg by mouth daily as needed for anxiety. Take 1  tablet by mouth once daily as needed.   amphetamine-dextroamphetamine 5 MG tablet Commonly known as: ADDERALL Take 5 mg by mouth daily as needed. Take 1-2 tablets (5-10mg  total) by mouth once daily.   azithromycin 250 MG tablet Commonly known as: ZITHROMAX azithromycin 250 mg tablet  TAKE 2 TABLETS BY MOUTH ON DAY 1 THEN TAKE 1 TABLET DAILY FOR THE NEXT 4 DAYS   buPROPion 300 MG 24 hr tablet Commonly known as: WELLBUTRIN XL Take 300 mg by mouth daily. Take 1 tablet by mouth once daily.   hydrochlorothiazide 25 MG tablet Commonly known as: HYDRODIURIL Take 25 mg by mouth daily as needed. Take 1 tablet by mouth once daily as needed.   levothyroxine 88 MCG tablet Commonly known as: SYNTHROID Take 1 tablet (88 mcg total) by mouth daily.   mupirocin ointment 2 % Commonly known as: BACTROBAN Apply 1 application topically 3 times/day as needed-between meals & bedtime.   rizatriptan 10 MG tablet Commonly known as: MAXALT Take 10 mg by mouth daily as needed for migraine. Take 1 tablet by mouth once daily as needed.   triamcinolone 0.1 % Commonly known as: KENALOG triamcinolone acetonide 0.1 % topical cream  APPLY TOPICALLY TO THE AFFECTED AREA(S) TWICE DAILY   Ventolin HFA 108 (90 Base) MCG/ACT inhaler Generic drug: albuterol as needed.       Review of Systems:  She is postmenopausal since about 2013.           She takes HCTZ for edema as needed  from her PCP   No history of hypertension  She has occasional difficulty swallowing and sometimes food  seems to get stuck in her throat.  She still has not discussed this with PCP    Examination:    BP 136/84   Pulse 99   Ht 5\' 2"  (1.575 m)   Wt 237 lb 6.4 oz (107.7 kg)   SpO2 99%   BMI 43.42 kg/m   She is not unusually anxious Biceps reflexes show normal relaxation No tremor   Assessments/Plan   1.  Secondary Hypothyroidism with baseline low free T4 level and symptoms of fatigue, weight gain, memory  difficulties and  hair loss She has had control of her symptoms with levothyroxine supplementation since about 2015  Now taking 88 mcg levothyroxine  She does have persistent nonspecific anxiety and relatively fast pulse, this may be more related to anxiety and stress as well as menopausal symptoms Since free T4 is back to previous levels she will continue 88 mcg daily  2. She will be seen by PCP regarding difficulty swallowing, may have esophageal  issue  Reather Littler 09/27/2020, 9:19 AM

## 2020-10-26 ENCOUNTER — Other Ambulatory Visit: Payer: Self-pay | Admitting: Endocrinology

## 2020-10-26 DIAGNOSIS — B349 Viral infection, unspecified: Secondary | ICD-10-CM | POA: Diagnosis not present

## 2020-10-26 DIAGNOSIS — Z20822 Contact with and (suspected) exposure to covid-19: Secondary | ICD-10-CM | POA: Diagnosis not present

## 2020-10-26 MED ORDER — LEVOTHYROXINE SODIUM 88 MCG PO TABS: 88.0000 ug | ORAL_TABLET | Freq: Every day | ORAL | 1 refills | Status: DC

## 2020-10-26 MED FILL — LEVOTHYROXINE 88 MCG TABLET: 88 | 90 days supply | Qty: 90 | Fill #0

## 2020-10-26 MED FILL — HYDROCODONE-CHLORPHEN ER SU: 10-8 | 5 days supply | Qty: 50 | Fill #0

## 2020-10-30 ENCOUNTER — Other Ambulatory Visit (HOSPITAL_COMMUNITY): Payer: Self-pay | Admitting: Family Medicine

## 2020-10-30 MED FILL — AZITHROMYCIN 250 MG TABLET: 250 | 5 days supply | Qty: 6 | Fill #0

## 2020-12-11 DIAGNOSIS — E7800 Pure hypercholesterolemia, unspecified: Secondary | ICD-10-CM | POA: Diagnosis not present

## 2020-12-11 DIAGNOSIS — G5603 Carpal tunnel syndrome, bilateral upper limbs: Secondary | ICD-10-CM | POA: Diagnosis not present

## 2020-12-11 DIAGNOSIS — R1312 Dysphagia, oropharyngeal phase: Secondary | ICD-10-CM | POA: Diagnosis not present

## 2020-12-11 DIAGNOSIS — R1011 Right upper quadrant pain: Secondary | ICD-10-CM | POA: Diagnosis not present

## 2020-12-12 ENCOUNTER — Other Ambulatory Visit: Payer: Self-pay | Admitting: Family Medicine

## 2020-12-13 ENCOUNTER — Other Ambulatory Visit: Payer: Self-pay | Admitting: Family Medicine

## 2020-12-13 DIAGNOSIS — R1011 Right upper quadrant pain: Secondary | ICD-10-CM

## 2021-01-02 ENCOUNTER — Ambulatory Visit
Admission: RE | Admit: 2021-01-02 | Discharge: 2021-01-02 | Disposition: A | Discharge status: Home / Self Care | Payer: BC Managed Care – PPO | Source: Ambulatory Visit | Attending: Family Medicine | Admitting: Family Medicine

## 2021-01-02 DIAGNOSIS — R1011 Right upper quadrant pain: Secondary | ICD-10-CM

## 2021-01-03 DIAGNOSIS — G5603 Carpal tunnel syndrome, bilateral upper limbs: Secondary | ICD-10-CM | POA: Diagnosis not present

## 2021-01-08 DIAGNOSIS — G5603 Carpal tunnel syndrome, bilateral upper limbs: Secondary | ICD-10-CM | POA: Diagnosis not present

## 2021-01-12 DIAGNOSIS — G5603 Carpal tunnel syndrome, bilateral upper limbs: Secondary | ICD-10-CM | POA: Diagnosis not present

## 2021-01-24 HISTORY — PX: CARPAL TUNNEL RELEASE: SHX101

## 2021-01-25 ENCOUNTER — Other Ambulatory Visit (HOSPITAL_COMMUNITY): Payer: Self-pay

## 2021-01-25 DIAGNOSIS — G5601 Carpal tunnel syndrome, right upper limb: Secondary | ICD-10-CM | POA: Diagnosis not present

## 2021-01-25 MED ORDER — HYDROCODONE-ACETAMINOPHEN 5-325 MG PO TABS
ORAL_TABLET | ORAL | 0 refills | Status: DC
Start: 2021-01-25 — End: 2021-02-12
  Filled 2021-01-25: qty 10, 1d supply, fill #0

## 2021-01-30 ENCOUNTER — Other Ambulatory Visit (HOSPITAL_COMMUNITY): Payer: Self-pay

## 2021-01-30 MED FILL — Levothyroxine Sodium Tab 88 MCG: ORAL | 90 days supply | Qty: 90 | Fill #0 | Status: AC

## 2021-02-06 ENCOUNTER — Other Ambulatory Visit (HOSPITAL_COMMUNITY): Payer: Self-pay

## 2021-02-06 DIAGNOSIS — G5601 Carpal tunnel syndrome, right upper limb: Secondary | ICD-10-CM | POA: Diagnosis not present

## 2021-02-06 DIAGNOSIS — M25631 Stiffness of right wrist, not elsewhere classified: Secondary | ICD-10-CM | POA: Diagnosis not present

## 2021-02-06 MED ORDER — BUPROPION HCL ER (XL) 300 MG PO TB24
1.0000 | ORAL_TABLET | Freq: Every morning | ORAL | 1 refills | Status: DC
  Filled 2021-02-06: qty 90, 90d supply, fill #0
  Filled 2021-05-03: qty 90, 90d supply, fill #1

## 2021-02-07 ENCOUNTER — Other Ambulatory Visit (HOSPITAL_COMMUNITY): Payer: Self-pay

## 2021-02-07 MED ORDER — HYDROCHLOROTHIAZIDE 25 MG PO TABS
ORAL_TABLET | ORAL | 0 refills | Status: DC
  Filled 2021-02-07: qty 90, 90d supply, fill #0

## 2021-02-09 ENCOUNTER — Other Ambulatory Visit (HOSPITAL_COMMUNITY): Payer: Self-pay

## 2021-02-12 ENCOUNTER — Other Ambulatory Visit (HOSPITAL_COMMUNITY): Payer: Self-pay

## 2021-02-12 DIAGNOSIS — G5602 Carpal tunnel syndrome, left upper limb: Secondary | ICD-10-CM | POA: Diagnosis not present

## 2021-02-12 HISTORY — PX: CARPAL TUNNEL RELEASE: SHX101

## 2021-02-12 MED ORDER — HYDROCODONE-ACETAMINOPHEN 5-325 MG PO TABS
ORAL_TABLET | ORAL | 0 refills | Status: DC
Start: 2021-02-12 — End: 2021-08-09
  Filled 2021-02-12: qty 10, 1d supply, fill #0

## 2021-02-23 DIAGNOSIS — M25532 Pain in left wrist: Secondary | ICD-10-CM | POA: Diagnosis not present

## 2021-03-26 ENCOUNTER — Other Ambulatory Visit: Payer: BC Managed Care – PPO

## 2021-03-29 ENCOUNTER — Ambulatory Visit: Payer: BC Managed Care – PPO | Admitting: Endocrinology

## 2021-03-30 ENCOUNTER — Other Ambulatory Visit: Payer: Self-pay

## 2021-03-30 ENCOUNTER — Other Ambulatory Visit (INDEPENDENT_AMBULATORY_CARE_PROVIDER_SITE_OTHER): Payer: BC Managed Care – PPO

## 2021-03-30 DIAGNOSIS — E038 Other specified hypothyroidism: Secondary | ICD-10-CM | POA: Diagnosis not present

## 2021-03-30 LAB — T3, FREE: T3, Free: 4.2 pg/mL (ref 2.3–4.2)

## 2021-03-30 LAB — TSH: TSH: 0.01 u[IU]/mL — ABNORMAL LOW (ref 0.35–5.50)

## 2021-03-30 LAB — T4, FREE: Free T4: 1.11 ng/dL (ref 0.60–1.60)

## 2021-04-04 ENCOUNTER — Ambulatory Visit: Payer: BC Managed Care – PPO | Admitting: Endocrinology

## 2021-04-04 ENCOUNTER — Other Ambulatory Visit (HOSPITAL_COMMUNITY): Payer: Self-pay

## 2021-04-04 ENCOUNTER — Encounter: Payer: Self-pay | Admitting: Endocrinology

## 2021-04-04 ENCOUNTER — Other Ambulatory Visit: Payer: Self-pay

## 2021-04-04 VITALS — BP 138/92 | HR 94 | Ht 62.0 in | Wt 232.0 lb

## 2021-04-04 DIAGNOSIS — E038 Other specified hypothyroidism: Secondary | ICD-10-CM | POA: Diagnosis not present

## 2021-04-04 MED ORDER — LEVOTHYROXINE SODIUM 75 MCG PO TABS
75.0000 ug | ORAL_TABLET | Freq: Every day | ORAL | 3 refills | Status: DC
  Filled 2021-04-04: qty 90, 90d supply, fill #0
  Filled 2021-08-15: qty 90, 90d supply, fill #1
  Filled 2021-11-23: qty 90, 90d supply, fill #2
  Filled 2022-02-28: qty 90, 90d supply, fill #3

## 2021-04-04 NOTE — Progress Notes (Signed)
Patient ID: Joyce Watts, female   DOB: 1960-12-24, 60 y.o.   MRN: [id number]    Reason for Appointment: Follow-up of thyroid    History of Present Illness:   She was seen in evaluation for secondary hypothyroidism in 04/2014 At that time she was having symptoms of fatigue, weight gain, memory difficulties, brittle nails and some hair loss; symptoms had started in 5/15  She was found to have a low free T4 of 0.59 by her PCP and referred here for further management. TSH previously was normal She was evaluated for pituitary hypofunction and all the labs were normal including appropriate level of  FSH Initially after starting supplementation patient had improved her energy level as well as less issues with concentration and memory.  Recent history:  In 12/19 she was concerned about her nail splitting, and hair loss Also was complaining of some cold intolerance and difficulty focusing mentally Subsequently in 3/21 she was complaining of more fatigue than usual which was nonspecific She tends to have chronic coldness of her feet and some dry skin  She was taking 112 mcg of levothyroxine daily and this was reduced 10 08/2020 when she was having more symptoms of anxiety and her free T4 was 1.43   She has not been taking 88 mcg levothyroxine consistently Only occasionally will have nervousness, does not think her heart rate is fast usually No unusual fatigue Her weight is down 5 pounds since last visit in 1/22  She takes Adderall to help her focus mentally better  She is very regular with taking levothyroxine supplement before breakfast  Free T4 is about the same and 1.1 but her free T3 is upper normal at 4.2  Again has suppressed TSH    Lab Results  Component Value Date   FREET4 1.11 03/30/2021   FREET4 1.06 09/21/2020   FREET4 1.43 08/04/2020   TSH 0.01 (L) 03/30/2021   TSH <0.01 (L) 09/21/2020   TSH <0.01 (L) 08/04/2020      Lab Results  Component Value Date    T3FREE 4.2 03/30/2021   T3FREE 3.8 09/21/2020   T3FREE 3.9 08/04/2020   T3FREE 3.3 10/29/2019   T3FREE 3.6 05/03/2019    Wt Readings from Last 3 Encounters:  04/04/21 232 lb (105.2 kg)  09/27/20 237 lb 6.4 oz (107.7 kg)  08/11/20 242 lb (109.8 kg)    PROBLEM 2:  Left thyroid nodule.   On her  initial exam in 2015 she was found to have a thyroid nodule on the left side and on ultrasound this was a solid nodule, measuring 4.8 cm  She will have occasional difficulty swallowing as before, she gets a feeling of food stopping the upper chest This may be slightly better with taking Pepcid as recommended by PCP   02/2020 ultrasound report: Nodule within the left thyroid is essentially unchanged in size from the comparison ultrasound in 2015  Thyroid biopsy showed benign follicular cells    Past Medical History:  Diagnosis Date   Anxiety    Carpal tunnel syndrome    Depression    Hyperlipidemia    LDL in 6/15 = 133   Migraines    Past history    No past surgical history on file.  Family History  Problem Relation Age of Onset   Hypertension Father    Diabetes Father    Diabetes Maternal Grandfather    Diabetes Paternal Grandfather    Thyroid disease Neg Hx     Social History:  reports that she has never smoked. She has never used smokeless tobacco. No history on file for alcohol use and drug use.  Allergies:  Allergies  Allergen Reactions   Bacitracin Other (See Comments)   Neomycin Other (See Comments)   Sulfa Antibiotics Rash    Allergies as of 04/04/2021       Reactions   Bacitracin Other (See Comments)   Neomycin Other (See Comments)   Sulfa Antibiotics Rash        Medication List        Accurate as of April 04, 2021  9:17 AM. If you have any questions, ask your nurse or doctor.          ALPRAZolam 0.5 MG tablet Commonly known as: XANAX Take 0.5 mg by mouth daily as needed for anxiety. Take 1 tablet by mouth once daily as needed.    amphetamine-dextroamphetamine 5 MG tablet Commonly known as: ADDERALL Take 5 mg by mouth daily as needed. Take 1-2 tablets (5-10mg  total) by mouth once daily.   azithromycin 250 MG tablet Commonly known as: ZITHROMAX   azithromycin 250 MG tablet Commonly known as: ZITHROMAX TAKE 2 TABLETS BY MOUTH ON DAY 1 THEN TAKE 1 TABLET DAILY FOR THE NEXT 4 DAYS   azithromycin 250 MG tablet Commonly known as: ZITHROMAX TAKE 2 TABLETS BY MOUTH ON DAY 1 THEN TAKE 1 TABLET DAILY FOR THE NEXT 4 DAYS   buPROPion 300 MG 24 hr tablet Commonly known as: WELLBUTRIN XL Take 300 mg by mouth daily. Take 1 tablet by mouth once daily.   buPROPion 300 MG 24 hr tablet Commonly known as: WELLBUTRIN XL TAKE 1 TABLET BY MOUTH EVERY MORNING   chlorpheniramine-HYDROcodone 10-8 MG/5ML Suer Commonly known as: TUSSIONEX TAKE 5 ML BY MOUTH EVERY 12 HOURS AS NEEDED FOR 5 DAYS   hydrochlorothiazide 25 MG tablet Commonly known as: HYDRODIURIL Take 25 mg by mouth daily as needed. Take 1 tablet by mouth once daily as needed.   hydrochlorothiazide 25 MG tablet Commonly known as: HYDRODIURIL Take 1 tablet by mouth every morning   HYDROcodone-acetaminophen 5-325 MG tablet Commonly known as: NORCO/VICODIN Take 1-2 tablets by mouth every 4-6 hours as needed for pain, for 5 days. (NOT TO EXCEED 10 TABLETS A DAY)   HYDROcodone-homatropine 5-1.5 MG/5ML syrup Commonly known as: HYCODAN TAKE BY MOUTH EVERY 6 HOURS AS NEEDED   levothyroxine 88 MCG tablet Commonly known as: SYNTHROID TAKE 1 TABLET BY MOUTH ONCE A DAY   mupirocin ointment 2 % Commonly known as: BACTROBAN Apply 1 application topically 3 times/day as needed-between meals & bedtime.   rizatriptan 10 MG tablet Commonly known as: MAXALT Take 10 mg by mouth daily as needed for migraine. Take 1 tablet by mouth once daily as needed.   triamcinolone cream 0.1 % Commonly known as: KENALOG triamcinolone acetonide 0.1 % topical cream  APPLY TOPICALLY  TO THE AFFECTED AREA(S) TWICE DAILY   Ventolin HFA 108 (90 Base) MCG/ACT inhaler Generic drug: albuterol as needed.        Review of Systems:  She is postmenopausal since about 2013.           She takes HCTZ for edema as needed  from her PCP   No history of hypertension  She has occasional difficulty swallowing and sometimes food  seems to get stuck in her throat.  She still has not discussed this with PCP    Examination:    BP (!) 138/92 (BP Location: Left Arm, Patient Position:  Sitting, Cuff Size: Normal)   Pulse 94   Ht 5\' 2"  (1.575 m)   Wt 232 lb (105.2 kg)   SpO2 98%   BMI 42.43 kg/m   Thyroid nodule is felt on the left side on swallowing but difficult to assess the size Biceps reflexes show normal relaxation Skin appears normal   Assessments/Plan   1.  Secondary Hypothyroidism with baseline low free T4 level and symptoms of fatigue, weight gain, memory difficulties and  hair loss She has had control of her symptoms with levothyroxine supplementation since about 2015  Now taking 88 mcg levothyroxine  She is subjectively doing well and looks euthyroid However since her free T3 is upper normal we will reduce her dose to 75 mcg Follow-up in 4 months  2.  Benign left thyroid nodule: Difficult to measure on exam, last year her ultrasound had shown no change  2016 04/04/2021, 9:17 AM

## 2021-05-02 ENCOUNTER — Other Ambulatory Visit (HOSPITAL_COMMUNITY): Payer: Self-pay

## 2021-05-02 MED ORDER — VALACYCLOVIR HCL 1 G PO TABS
ORAL_TABLET | ORAL | 3 refills | Status: AC
  Filled 2021-05-02: qty 20, 5d supply, fill #0

## 2021-05-03 ENCOUNTER — Other Ambulatory Visit (HOSPITAL_COMMUNITY): Payer: Self-pay

## 2021-05-29 ENCOUNTER — Other Ambulatory Visit (HOSPITAL_COMMUNITY): Payer: Self-pay

## 2021-05-29 MED ORDER — ALPRAZOLAM 0.5 MG PO TABS
ORAL_TABLET | ORAL | 5 refills | Status: DC
Start: 2021-05-29 — End: 2023-09-10
  Filled 2021-05-29: qty 60, 30d supply, fill #0
  Filled 2021-08-29 – 2021-09-07 (×2): qty 60, 30d supply, fill #1

## 2021-06-19 DIAGNOSIS — E7800 Pure hypercholesterolemia, unspecified: Secondary | ICD-10-CM | POA: Diagnosis not present

## 2021-06-19 DIAGNOSIS — R600 Localized edema: Secondary | ICD-10-CM | POA: Diagnosis not present

## 2021-06-19 DIAGNOSIS — E039 Hypothyroidism, unspecified: Secondary | ICD-10-CM | POA: Diagnosis not present

## 2021-06-19 DIAGNOSIS — Z23 Encounter for immunization: Secondary | ICD-10-CM | POA: Diagnosis not present

## 2021-06-19 DIAGNOSIS — F419 Anxiety disorder, unspecified: Secondary | ICD-10-CM | POA: Diagnosis not present

## 2021-07-22 DIAGNOSIS — Z1211 Encounter for screening for malignant neoplasm of colon: Secondary | ICD-10-CM | POA: Diagnosis not present

## 2021-08-06 ENCOUNTER — Other Ambulatory Visit (HOSPITAL_COMMUNITY): Payer: Self-pay

## 2021-08-07 ENCOUNTER — Other Ambulatory Visit (INDEPENDENT_AMBULATORY_CARE_PROVIDER_SITE_OTHER): Payer: BC Managed Care – PPO

## 2021-08-07 ENCOUNTER — Other Ambulatory Visit: Payer: Self-pay

## 2021-08-07 ENCOUNTER — Other Ambulatory Visit (HOSPITAL_COMMUNITY): Payer: Self-pay

## 2021-08-07 DIAGNOSIS — E038 Other specified hypothyroidism: Secondary | ICD-10-CM | POA: Diagnosis not present

## 2021-08-07 LAB — T4, FREE: Free T4: 1.05 ng/dL (ref 0.60–1.60)

## 2021-08-07 LAB — T3, FREE: T3, Free: 3.6 pg/mL (ref 2.3–4.2)

## 2021-08-07 LAB — TSH: TSH: 0.02 u[IU]/mL — ABNORMAL LOW (ref 0.35–5.50)

## 2021-08-07 MED ORDER — BUPROPION HCL ER (XL) 300 MG PO TB24
300.0000 mg | ORAL_TABLET | Freq: Every morning | ORAL | 1 refills | Status: DC
  Filled 2021-08-07: qty 90, 90d supply, fill #0
  Filled 2021-11-14: qty 90, 90d supply, fill #1

## 2021-08-08 ENCOUNTER — Other Ambulatory Visit (HOSPITAL_COMMUNITY): Payer: Self-pay

## 2021-08-09 ENCOUNTER — Ambulatory Visit (INDEPENDENT_AMBULATORY_CARE_PROVIDER_SITE_OTHER): Payer: BC Managed Care – PPO | Admitting: Endocrinology

## 2021-08-09 ENCOUNTER — Other Ambulatory Visit: Payer: Self-pay

## 2021-08-09 VITALS — BP 138/90 | HR 81 | Ht 62.0 in | Wt 231.2 lb

## 2021-08-09 DIAGNOSIS — E038 Other specified hypothyroidism: Secondary | ICD-10-CM

## 2021-08-09 NOTE — Progress Notes (Signed)
Patient ID: Joyce Watts, female   DOB: 10-Nov-1960, 61 y.o.   MRN: [id number]    Reason for Appointment: Follow-up of thyroid    History of Present Illness:   She was seen in evaluation for secondary hypothyroidism in 04/2014 At that time she was having symptoms of fatigue, weight gain, memory difficulties, brittle nails and some hair loss; symptoms had started in 5/15  She was found to have a low free T4 of 0.59 by her PCP and referred here for further management. TSH previously was normal She was evaluated for pituitary hypofunction and all the labs were normal including appropriate level of  FSH Initially after starting supplementation patient had improved her energy level as well as less issues with concentration and memory.  Recent history:  In 12/19 she was concerned about her nail splitting, hair loss, cold intolerance and difficulty focusing mentally Subsequently in 3/21 she was complaining of more fatigue than usual which was nonspecific She tends to have chronic coldness of her feet and some dry skin  She was taking 112 mcg of levothyroxine daily and this was reduced 10 08/2020 when she was having more symptoms of anxiety and her free T4 was 1.43  The dose was further reduced in 8/22 down to 75 mcg from 88 because of occasional symptoms of nervousness and also high normal free T3 level of 4.2  She has been taking 75 mcg levothyroxine about 30 minutes before breakfast daily regularly She does not have any further nervousness, does not think her heart rate is fast Also energy level may be a little better at she started feeling better a couple of weeks after the dosage change  Weight is stable  She also takes Adderall to help her focus mentally better  She is very regular with taking levothyroxine supplement before breakfast  Free T4 is about the same/slightly lower and her free T3 is more normal   Again has suppressed TSH, now 0.02   Lab Results  Component  Value Date   FREET4 1.05 08/07/2021   FREET4 1.11 03/30/2021   FREET4 1.06 09/21/2020   TSH 0.02 (L) 08/07/2021   TSH 0.01 (L) 03/30/2021   TSH <0.01 (L) 09/21/2020      Lab Results  Component Value Date   T3FREE 3.6 08/07/2021   T3FREE 4.2 03/30/2021   T3FREE 3.8 09/21/2020   T3FREE 3.9 08/04/2020   T3FREE 3.3 10/29/2019    Wt Readings from Last 3 Encounters:  08/09/21 231 lb 3.2 oz (104.9 kg)  04/04/21 232 lb (105.2 kg)  09/27/20 237 lb 6.4 oz (107.7 kg)    PROBLEM 2:  Left thyroid nodule.   On her  initial exam in 2015 she was found to have a thyroid nodule on the left side and on ultrasound this was a solid nodule, measuring 4.8 cm  She will have occasional difficulty swallowing as before, she gets a feeling of food stopping the upper chest This may be slightly better with taking Pepcid as recommended by PCP   02/2020 ultrasound report: Nodule within the left thyroid is essentially unchanged in size from the comparison ultrasound in 2015  Thyroid biopsy showed benign follicular cells    Past Medical History:  Diagnosis Date   Anxiety    Carpal tunnel syndrome    Depression    Hyperlipidemia    LDL in 6/15 = 133   Migraines    Past history    No past surgical history on file.  Family  History  Problem Relation Age of Onset   Hypertension Father    Diabetes Father    Diabetes Maternal Grandfather    Diabetes Paternal Grandfather    Thyroid disease Neg Hx     Social History:  reports that she has never smoked. She has never used smokeless tobacco. No history on file for alcohol use and drug use.  Allergies:  Allergies  Allergen Reactions   Bacitracin Other (See Comments)   Neomycin Other (See Comments)   Sulfa Antibiotics Rash    Allergies as of 08/09/2021       Reactions   Bacitracin Other (See Comments)   Neomycin Other (See Comments)   Sulfa Antibiotics Rash        Medication List        Accurate as of August 09, 2021  8:28 AM.  If you have any questions, ask your nurse or doctor.          ALPRAZolam 0.5 MG tablet Commonly known as: XANAX Take 0.5 mg by mouth daily as needed for anxiety. Take 1 tablet by mouth once daily as needed.   ALPRAZolam 0.5 MG tablet Commonly known as: Xanax Take 1 tablet by mouth 3 times a day  as needed for anxiety for 30 days   amphetamine-dextroamphetamine 5 MG tablet Commonly known as: ADDERALL Take 5 mg by mouth daily as needed. Take 1-2 tablets (5-10mg  total) by mouth once daily.   buPROPion 300 MG 24 hr tablet Commonly known as: WELLBUTRIN XL Take 300 mg by mouth daily. Take 1 tablet by mouth once daily.   buPROPion 300 MG 24 hr tablet Commonly known as: WELLBUTRIN XL TAKE 1 TABLET BY MOUTH EVERY MORNING   hydrochlorothiazide 25 MG tablet Commonly known as: HYDRODIURIL Take 25 mg by mouth daily as needed. Take 1 tablet by mouth once daily as needed.   hydrochlorothiazide 25 MG tablet Commonly known as: HYDRODIURIL Take 1 tablet by mouth every morning   HYDROcodone-acetaminophen 5-325 MG tablet Commonly known as: NORCO/VICODIN Take 1-2 tablets by mouth every 4-6 hours as needed for pain, for 5 days. (NOT TO EXCEED 10 TABLETS A DAY)   levothyroxine 75 MCG tablet Commonly known as: SYNTHROID Take 1 tablet (75 mcg total) by mouth daily.   mupirocin ointment 2 % Commonly known as: BACTROBAN Apply 1 application topically 3 times/day as needed-between meals & bedtime.   rizatriptan 10 MG tablet Commonly known as: MAXALT Take 10 mg by mouth daily as needed for migraine. Take 1 tablet by mouth once daily as needed.   triamcinolone cream 0.1 % Commonly known as: KENALOG triamcinolone acetonide 0.1 % topical cream  APPLY TOPICALLY TO THE AFFECTED AREA(S) TWICE DAILY   valACYclovir 1000 MG tablet Commonly known as: VALTREX Take 2 tablets by mouth twice a day x 1 day per episode   Ventolin HFA 108 (90 Base) MCG/ACT inhaler Generic drug: albuterol as needed.         Review of Systems:  She is postmenopausal since about 2013.           She takes HCTZ for edema as needed  from her PCP   No history of hypertension     Examination:    BP 138/90 (BP Location: Left Arm, Patient Position: Sitting, Cuff Size: Normal)   Pulse 81   Ht 5\' 2"  (1.575 m)   Wt 231 lb 3.2 oz (104.9 kg)   SpO2 98%   BMI 42.29 kg/m   Left thyroid nodule is felt mostly  on swallowing but difficult to assess the size, likely about 3 cm No swelling of the face or puffiness of the eyes  Assessments/Plan   1.  Secondary Hypothyroidism with baseline low free T4 level and symptoms of fatigue, weight gain, memory difficulties and  hair loss She has had control of her symptoms with levothyroxine supplementation since about 2015  Now taking 75 mcg levothyroxine  She is subjectively doing better with reducing her dose with little more energy and less anxiety Thyroid levels are more normal now compared to her last visit She will continue the same dose and follow-up in 6 months  2.  Benign left thyroid nodule: Difficult to measure on exam, previously her ultrasound had shown no change  Reather Littler 08/09/2021, 8:28 AM

## 2021-08-10 ENCOUNTER — Other Ambulatory Visit (HOSPITAL_COMMUNITY): Payer: Self-pay

## 2021-08-13 ENCOUNTER — Other Ambulatory Visit (HOSPITAL_COMMUNITY): Payer: Self-pay

## 2021-08-15 ENCOUNTER — Other Ambulatory Visit (HOSPITAL_COMMUNITY): Payer: Self-pay

## 2021-08-17 ENCOUNTER — Other Ambulatory Visit (HOSPITAL_COMMUNITY): Payer: Self-pay

## 2021-08-20 ENCOUNTER — Other Ambulatory Visit (HOSPITAL_COMMUNITY): Payer: Self-pay

## 2021-08-29 ENCOUNTER — Other Ambulatory Visit (HOSPITAL_COMMUNITY): Payer: Self-pay

## 2021-08-29 MED ORDER — HYDROCHLOROTHIAZIDE 25 MG PO TABS
25.0000 mg | ORAL_TABLET | Freq: Every morning | ORAL | 0 refills | Status: DC
  Filled 2021-08-29 – 2021-09-07 (×2): qty 90, 90d supply, fill #0

## 2021-09-06 ENCOUNTER — Other Ambulatory Visit (HOSPITAL_COMMUNITY): Payer: Self-pay

## 2021-09-07 ENCOUNTER — Other Ambulatory Visit (HOSPITAL_COMMUNITY): Payer: Self-pay

## 2021-11-15 ENCOUNTER — Other Ambulatory Visit (HOSPITAL_COMMUNITY): Payer: Self-pay

## 2021-11-23 ENCOUNTER — Other Ambulatory Visit (HOSPITAL_COMMUNITY): Payer: Self-pay

## 2021-12-28 ENCOUNTER — Other Ambulatory Visit (HOSPITAL_COMMUNITY): Payer: Self-pay

## 2021-12-28 MED ORDER — MELOXICAM 15 MG PO TABS
ORAL_TABLET | ORAL | 1 refills | Status: DC
  Filled 2021-12-28: qty 30, 30d supply, fill #0
  Filled 2022-01-26: qty 30, 30d supply, fill #1

## 2022-01-26 ENCOUNTER — Other Ambulatory Visit (HOSPITAL_COMMUNITY): Payer: Self-pay

## 2022-02-05 ENCOUNTER — Other Ambulatory Visit (INDEPENDENT_AMBULATORY_CARE_PROVIDER_SITE_OTHER): Payer: 59

## 2022-02-05 ENCOUNTER — Other Ambulatory Visit (HOSPITAL_COMMUNITY): Payer: Self-pay

## 2022-02-05 DIAGNOSIS — E038 Other specified hypothyroidism: Secondary | ICD-10-CM | POA: Diagnosis not present

## 2022-02-05 LAB — T4, FREE: Free T4: 1.08 ng/dL (ref 0.60–1.60)

## 2022-02-05 LAB — T3, FREE: T3, Free: 3.2 pg/mL (ref 2.3–4.2)

## 2022-02-05 LAB — TSH: TSH: 0.01 u[IU]/mL — ABNORMAL LOW (ref 0.35–5.50)

## 2022-02-06 ENCOUNTER — Other Ambulatory Visit (HOSPITAL_COMMUNITY): Payer: Self-pay

## 2022-02-06 MED ORDER — BUPROPION HCL ER (XL) 300 MG PO TB24
ORAL_TABLET | ORAL | 1 refills | Status: DC
  Filled 2022-02-06: qty 90, 90d supply, fill #0
  Filled 2022-04-16: qty 90, 90d supply, fill #1

## 2022-02-06 MED ORDER — HYDROCHLOROTHIAZIDE 25 MG PO TABS
ORAL_TABLET | ORAL | 1 refills | Status: DC
  Filled 2022-02-06 – 2022-02-09 (×2): qty 90, 90d supply, fill #0
  Filled 2022-04-16: qty 90, 90d supply, fill #1

## 2022-02-07 NOTE — Progress Notes (Unsigned)
Patient ID: Joyce Watts, female   DOB: 12-06-1960, 61 y.o.   MRN: [id number]    Reason for Appointment: Follow-up of thyroid    History of Present Illness:   She was seen in evaluation for secondary hypothyroidism in 04/2014 At that time she was having symptoms of fatigue, weight gain, memory difficulties, brittle nails and some hair loss; symptoms had started in 5/15  She was found to have a low free T4 of 0.59 by her PCP and referred here for further management. TSH previously was normal She was evaluated for pituitary hypofunction and all the labs were normal including appropriate level of  FSH Initially after starting supplementation patient had improved her energy level as well as less issues with concentration and memory.  Recent history:  In 12/19 she was concerned about her nail splitting, hair loss, cold intolerance and difficulty focusing mentally Subsequently in 3/21 she was complaining of more fatigue than usual which was nonspecific She tends to have chronic coldness of her feet and some dry skin  She was taking 112 mcg of levothyroxine daily and this was reduced 10 08/2020 when she was having more symptoms of anxiety and her free T4 was 1.43  The dose was further reduced in 8/22 down to 75 mcg from 88 because of occasional symptoms of nervousness and also high normal free T3 level of 4.2  She has been taking 75 mcg levothyroxine about 30 minutes before breakfast daily regularly She does not have any further nervousness, does not think her heart rate is fast Also energy level may be a little better at she started feeling better a couple of weeks after the dosage change  Weight is stable  She also takes Adderall to help her focus mentally better  She is very regular with taking levothyroxine supplement before breakfast  Free T4 is about the same/slightly lower and her free T3 is more normal   Again has suppressed TSH, now 0.01   Lab Results  Component  Value Date   FREET4 1.08 02/05/2022   FREET4 1.05 08/07/2021   FREET4 1.11 03/30/2021   TSH 0.01 (L) 02/05/2022   TSH 0.02 (L) 08/07/2021   TSH 0.01 (L) 03/30/2021      Lab Results  Component Value Date   T3FREE 3.2 02/05/2022   T3FREE 3.6 08/07/2021   T3FREE 4.2 03/30/2021   T3FREE 3.8 09/21/2020   T3FREE 3.9 08/04/2020    Wt Readings from Last 3 Encounters:  02/08/22 218 lb (98.9 kg)  08/09/21 231 lb 3.2 oz (104.9 kg)  04/04/21 232 lb (105.2 kg)    PROBLEM 2:  Left thyroid nodule.   On her  initial exam in 2015 she was found to have a thyroid nodule on the left side and on ultrasound this was a solid nodule, measuring 4.8 cm  She will have occasional difficulty swallowing as before, she gets a feeling of food stopping the upper chest This may be slightly better with taking Pepcid as recommended by PCP   02/2020 ultrasound report: Nodule within the left thyroid is essentially unchanged in size from the comparison ultrasound in 2015  Thyroid biopsy showed benign follicular cells    Past Medical History:  Diagnosis Date   Anxiety    Carpal tunnel syndrome    Depression    Hyperlipidemia    LDL in 6/15 = 133   Migraines    Past history    No past surgical history on file.  Family History  Problem Relation Age of Onset   Hypertension Father    Diabetes Father    Diabetes Maternal Grandfather    Diabetes Paternal Grandfather    Thyroid disease Neg Hx     Social History:  reports that she has never smoked. She has never used smokeless tobacco. No history on file for alcohol use and drug use.  Allergies:  Allergies  Allergen Reactions   Bacitracin Other (See Comments)   Neomycin Other (See Comments)   Sulfa Antibiotics Rash    Allergies as of 02/08/2022       Reactions   Bacitracin Other (See Comments)   Neomycin Other (See Comments)   Sulfa Antibiotics Rash        Medication List        Accurate as of February 08, 2022  8:19 AM. If you have  any questions, ask your nurse or doctor.          ALPRAZolam 0.5 MG tablet Commonly known as: Xanax Take 1 tablet by mouth 3 times a day  as needed for anxiety for 30 days   amphetamine-dextroamphetamine 5 MG tablet Commonly known as: ADDERALL Take 5 mg by mouth daily as needed. Take 1-2 tablets (5-10mg  total) by mouth once daily.   buPROPion 300 MG 24 hr tablet Commonly known as: WELLBUTRIN XL TAKE 1 TABLET BY MOUTH EVERY MORNING   hydrochlorothiazide 25 MG tablet Commonly known as: HYDRODIURIL Take 1 tablet by mouth every morning   hydrochlorothiazide 25 MG tablet Commonly known as: HYDRODIURIL Take 1 tablet by mouth every morning.   levothyroxine 75 MCG tablet Commonly known as: SYNTHROID Take 1 tablet (75 mcg total) by mouth daily.   meloxicam 15 MG tablet Commonly known as: MOBIC Take 1 tablet by mouth once a day as needed for joint pain   mupirocin ointment 2 % Commonly known as: BACTROBAN Apply 1 application topically 3 times/day as needed-between meals & bedtime.   rizatriptan 10 MG tablet Commonly known as: MAXALT Take 10 mg by mouth daily as needed for migraine. Take 1 tablet by mouth once daily as needed.   triamcinolone cream 0.1 % Commonly known as: KENALOG triamcinolone acetonide 0.1 % topical cream  APPLY TOPICALLY TO THE AFFECTED AREA(S) TWICE DAILY   valACYclovir 1000 MG tablet Commonly known as: VALTREX Take 2 tablets by mouth twice a day x 1 day per episode   Ventolin HFA 108 (90 Base) MCG/ACT inhaler Generic drug: albuterol as needed.        Review of Systems:  She is postmenopausal since about 2013.           She takes HCTZ for edema as needed  from her PCP   No history of hypertension     Examination:    BP 138/84   Pulse 96   Ht 5\' 2"  (1.575 m)   Wt 218 lb (98.9 kg)   SpO2 96%   BMI 39.87 kg/m   Left thyroid nodule is felt mostly on swallowing but difficult to assess the size, likely about 3 cm No swelling of the  face or puffiness of the eyes  Assessments/Plan   1.  Secondary Hypothyroidism with baseline low free T4 level and symptoms of fatigue, weight gain, memory difficulties and  hair loss She has had control of her symptoms with levothyroxine supplementation since about 2015  taking 75 mcg levothyroxine  She is subjectively doing better with reducing her dose with little more energy and less anxiety Thyroid levels are more normal  now compared to her last visit She will continue the same dose and follow-up in 6 months  2.  Benign left thyroid nodule: Difficult to measure on exam, previously her ultrasound had shown no change  Reather LittlerAjay Kristy Catoe 02/08/2022, 8:19 AM

## 2022-02-08 ENCOUNTER — Encounter: Payer: Self-pay | Admitting: Endocrinology

## 2022-02-08 ENCOUNTER — Ambulatory Visit (INDEPENDENT_AMBULATORY_CARE_PROVIDER_SITE_OTHER): Payer: 59 | Admitting: Endocrinology

## 2022-02-08 VITALS — BP 138/84 | HR 96 | Ht 62.0 in | Wt 218.0 lb

## 2022-02-08 DIAGNOSIS — E041 Nontoxic single thyroid nodule: Secondary | ICD-10-CM

## 2022-02-08 DIAGNOSIS — E038 Other specified hypothyroidism: Secondary | ICD-10-CM

## 2022-02-09 ENCOUNTER — Other Ambulatory Visit (HOSPITAL_COMMUNITY): Payer: Self-pay

## 2022-02-19 IMAGING — MG DIGITAL SCREENING BILAT W/ TOMO W/ CAD
6 of 10 series · 6 of 30 positions shown · non-contrast
Comparison: Previous exam(s).

CLINICAL DATA: Screening.

EXAM:
DIGITAL SCREENING BILATERAL MAMMOGRAM WITH TOMO AND CAD

[L CC synth-2D (1 of 2)]
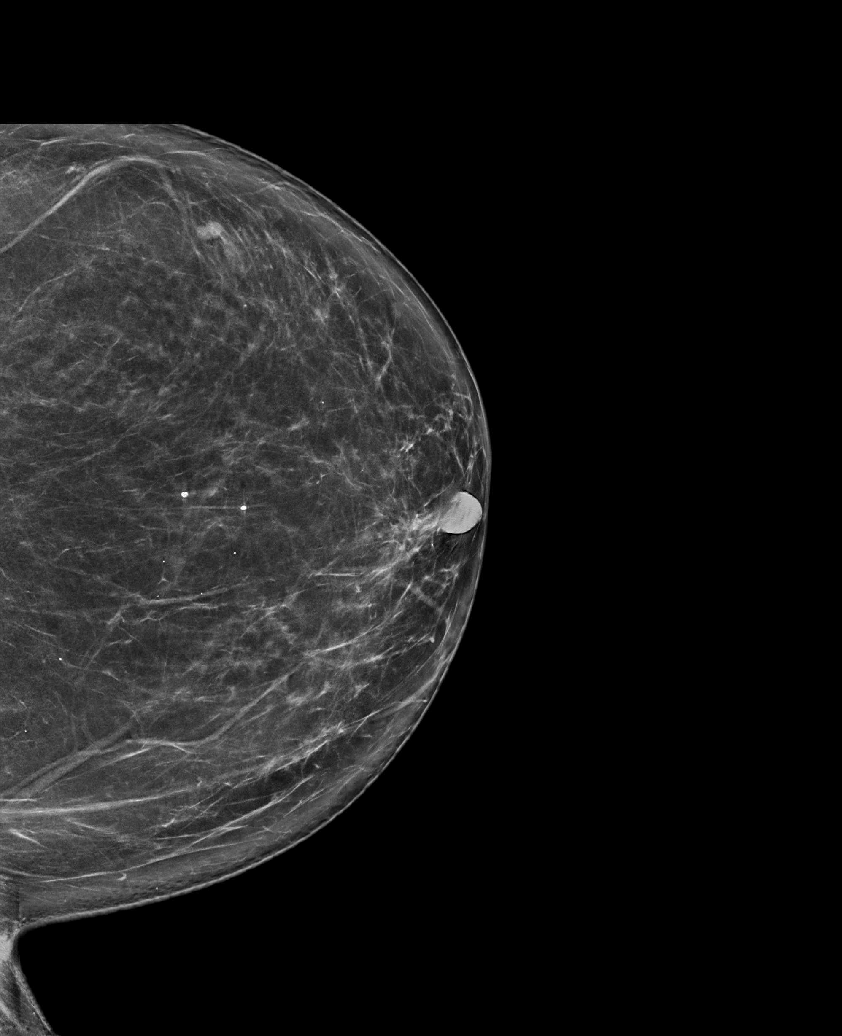

[L MLO synth-2D]
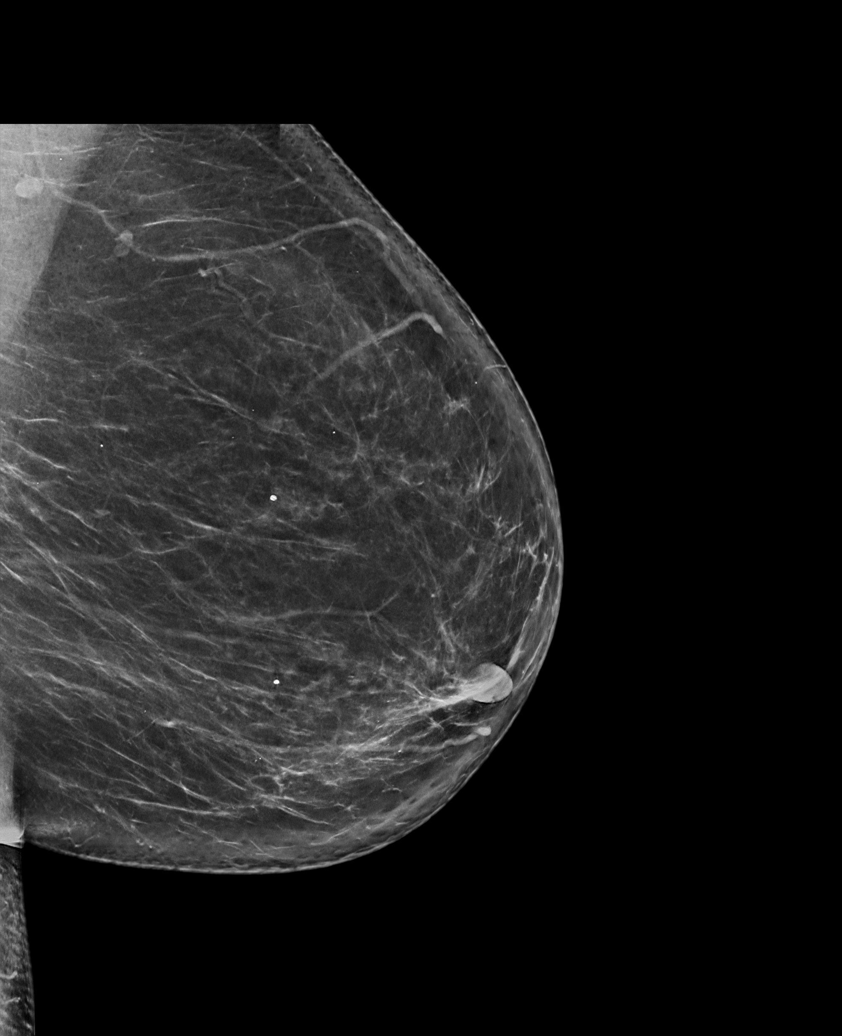

[L CC synth-2D (2 of 2)]
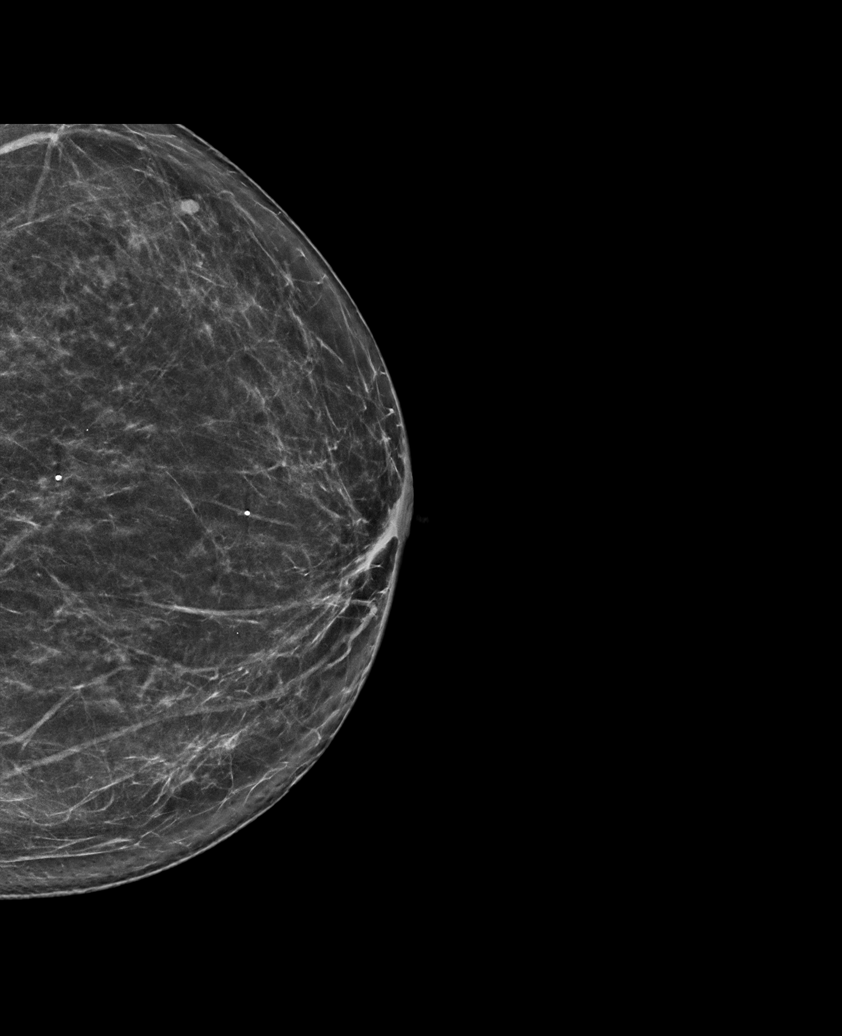

[R MLO synth-2D]
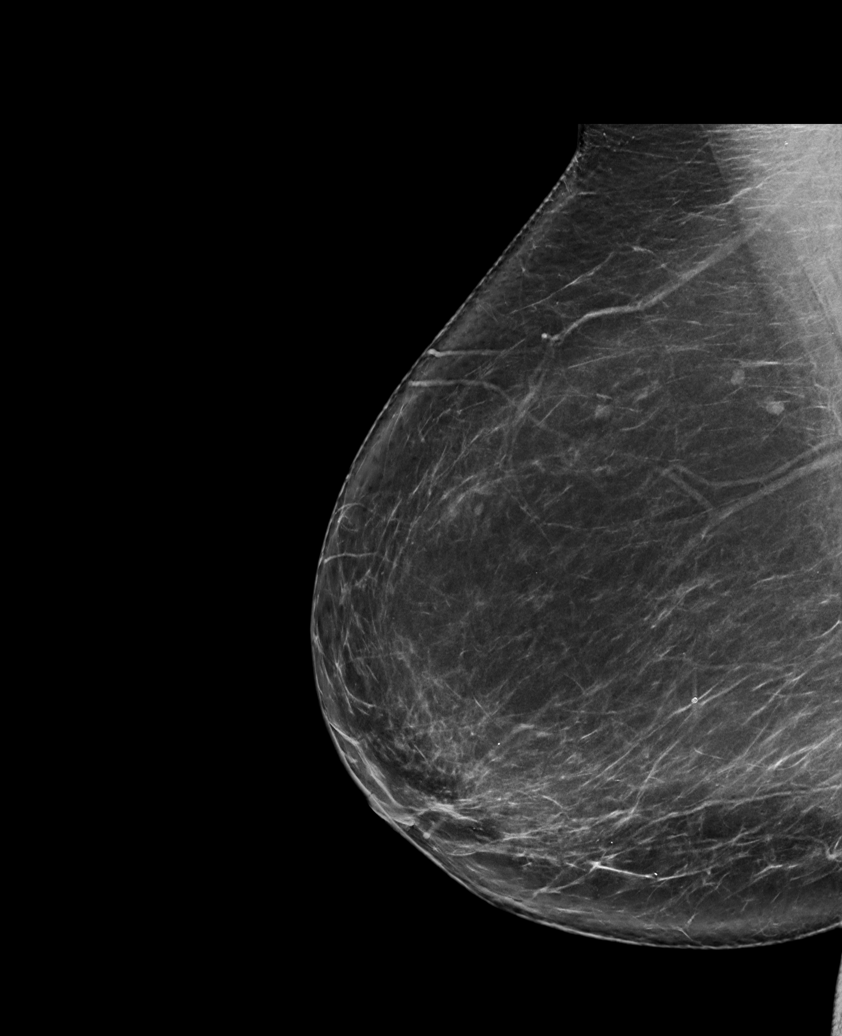

[R CC synth-2D]
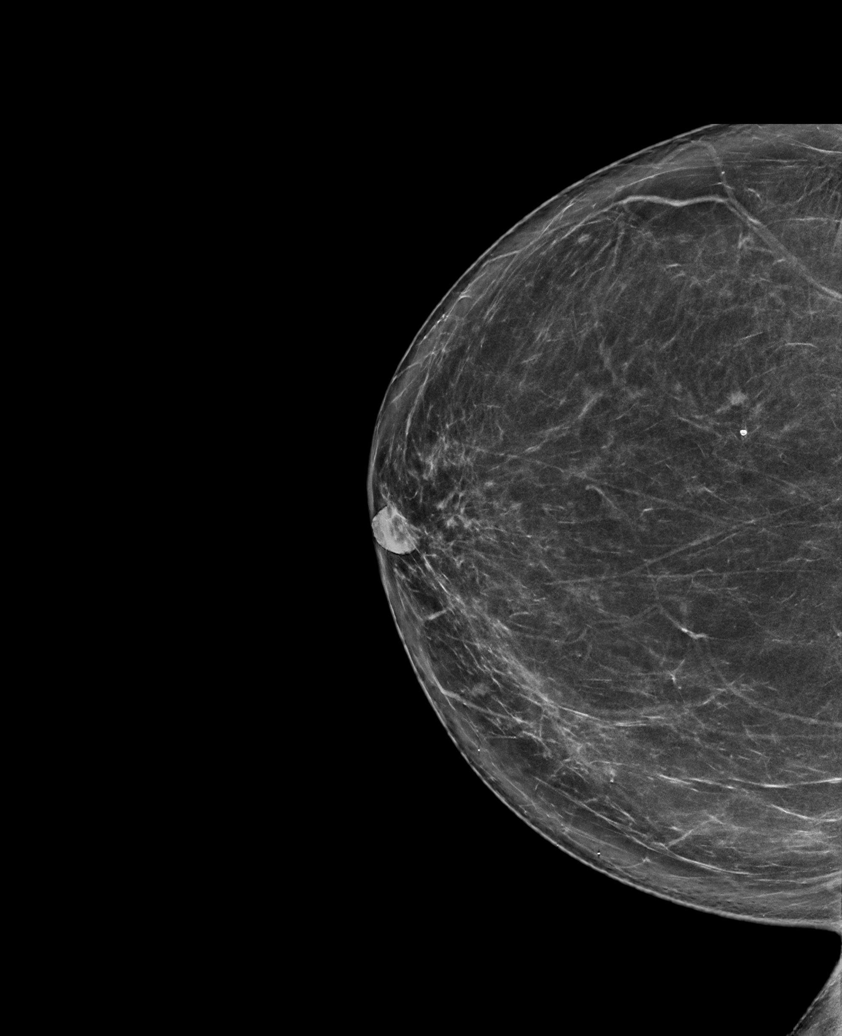

[R CC tomo · tomo slice 41/82.0]
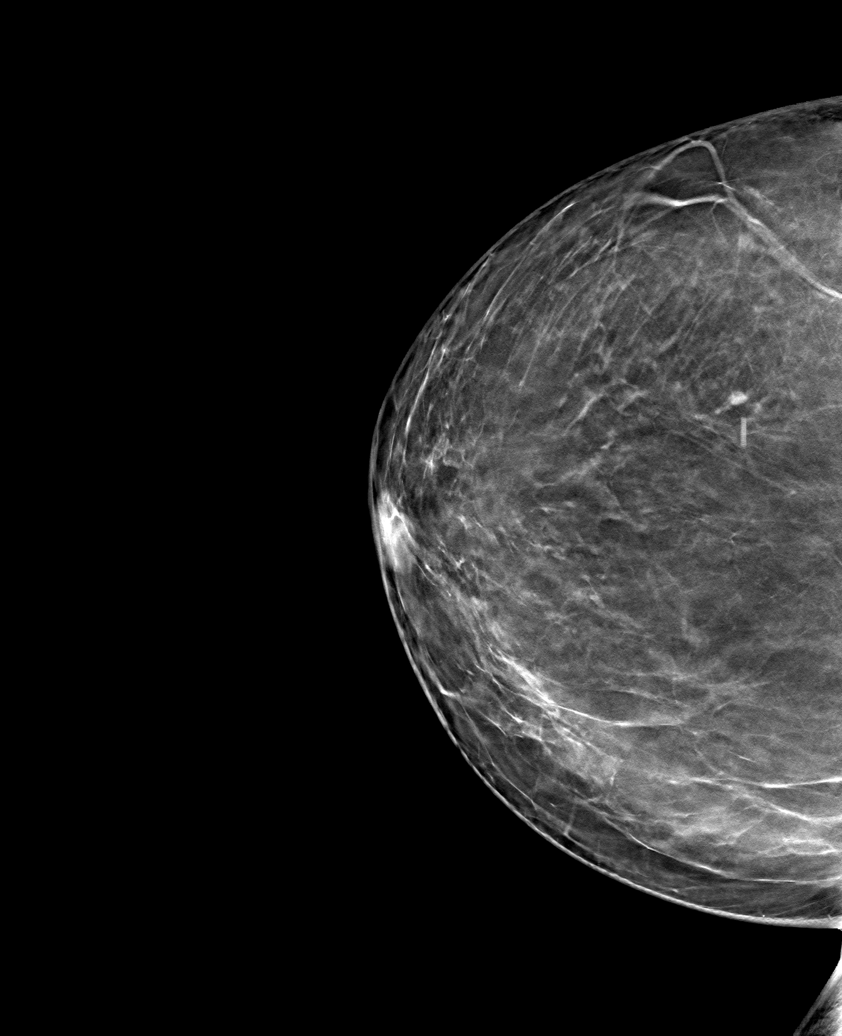

[6 of 30 positions shown; findings below may reference images not displayed]

ACR Breast Density Category b: There are scattered areas of
fibroglandular density.
FINDINGS: There are no findings suspicious for malignancy. Images were
processed with CAD.
IMPRESSION: No mammographic evidence of malignancy. A result letter of this
screening mammogram will be mailed directly to the patient.

RECOMMENDATION:
Screening mammogram in one year. (Code:[id number])

BI-RADS CATEGORY  1: Negative.

## 2022-02-26 ENCOUNTER — Other Ambulatory Visit (HOSPITAL_COMMUNITY): Payer: Self-pay

## 2022-02-26 MED ORDER — ALPRAZOLAM 0.5 MG PO TABS
0.5000 mg | ORAL_TABLET | ORAL | 0 refills | Status: DC
  Filled 2022-02-26: qty 60, 30d supply, fill #0

## 2022-02-27 ENCOUNTER — Other Ambulatory Visit (HOSPITAL_COMMUNITY): Payer: Self-pay

## 2022-02-27 MED ORDER — MELOXICAM 15 MG PO TABS
ORAL_TABLET | ORAL | 1 refills | Status: DC
  Filled 2022-02-27: qty 30, 30d supply, fill #0
  Filled 2022-04-23: qty 30, 30d supply, fill #1

## 2022-02-28 ENCOUNTER — Other Ambulatory Visit (HOSPITAL_COMMUNITY): Payer: Self-pay

## 2022-03-02 ENCOUNTER — Other Ambulatory Visit (HOSPITAL_COMMUNITY): Payer: Self-pay

## 2022-04-10 NOTE — Progress Notes (Unsigned)
Office Visit Note  Patient: Joyce Watts             Date of Birth: 1960/12/04           MRN: [id number]             PCP: Johny Blamer, MD Referring: Johny Blamer, MD Visit Date: 04/12/2022 Occupation: IT  Subjective:  Joint Pain (Knee pain, hand arthritis, shoulder and elbow pain and popping. )   History of Present Illness: Joyce Watts is a 61 y.o. female here for evaluation of joint pain in multiple areas especially with concern of increasing stiffness and intermittent swelling in the bilateral hands.  She has longstanding history of knee osteoarthritis has had steroid and viscosupplementation injections with Dr. Devonne Doughty.  Also has some chronic joint pain and frequent popping in her shoulders and elbows and hands but this has worsened.  She had bilateral carpal tunnel release surgery last year due to progressive worsening of sensory changes and had a good relief of symptoms after these.  However in about the past 6 months she has been experiencing worsening pain and stiffness in both hands also in the right shoulder and elbow.  She notices intermittent swelling or puffiness throughout the hands often lasting up to 1 or 2 hours daily.  She tried taking meloxicam for inflammation with no appreciable benefit although switching to twice daily naproxen she has seen partial improvement in symptoms when taking this.  She notices locking up sensation pretty frequently throughout the right arm and in fingers of both hands.  The locking up of fingers is often most severe at night or first thing in the morning.  She had some laboratory testing for this with a elevated sedimentation rate. Besides her joint pains she does feel some persistent fatigue and gets short of breath more easily with activity than normal for her.  She has chronic dryness of eyes and mouth.  Does not take any specific medications for this.  Activities of Daily Living:  Patient reports morning stiffness for 1-2 hours.    Patient Reports nocturnal pain.  Difficulty dressing/grooming: Denies Difficulty climbing stairs: Reports Difficulty getting out of chair: Reports Difficulty using hands for taps, buttons, cutlery, and/or writing: Denies  Review of Systems  Constitutional:  Positive for fatigue.  HENT:  Positive for mouth sores and mouth dryness.   Eyes:  Positive for dryness.  Respiratory:  Positive for shortness of breath.   Cardiovascular:  Negative for chest pain and palpitations.  Gastrointestinal:  Negative for blood in stool, constipation and diarrhea.  Endocrine: Negative for increased urination.  Genitourinary:  Negative for involuntary urination.  Musculoskeletal:  Positive for joint pain, joint pain, joint swelling, morning stiffness and muscle tenderness. Negative for myalgias, muscle weakness and myalgias.  Skin:  Negative for color change, rash, hair loss and sensitivity to sunlight.  Allergic/Immunologic: Negative for susceptible to infections.  Neurological:  Positive for headaches. Negative for dizziness.  Hematological:  Negative for swollen glands.  Psychiatric/Behavioral:  Positive for depressed mood and sleep disturbance. The patient is nervous/anxious.     PMFS History:  Patient Active Problem List   Diagnosis Date Noted   Bilateral hand pain 04/12/2022   Pain in right shoulder 04/12/2022   Elevated sed rate 04/12/2022   Other malaise and fatigue 04/21/2014   Secondary hypothyroidism 04/21/2014   Left thyroid nodule 04/21/2014    Past Medical History:  Diagnosis Date   Anxiety    Asthma    Carpal  tunnel syndrome    Depression    Hyperlipidemia    LDL in 6/15 = 133   Hypothyroidism    Dr. Lucianne Muss   Migraines    Past history    Family History  Problem Relation Age of Onset   Dementia Mother    High Cholesterol Mother    Hypertension Father    Diabetes Father    Bladder Cancer Father    Myasthenia gravis Father    Diabetes Maternal Grandfather    Cancer  Paternal Grandmother    Diabetes Paternal Grandfather    Thyroid disease Neg Hx    Past Surgical History:  Procedure Laterality Date   CARPAL TUNNEL RELEASE Right 01/24/2021   CARPAL TUNNEL RELEASE Left 02/12/2021   SPINAL CORD DECOMPRESSION  09/05/1992   C1   TONSILECTOMY/ADENOIDECTOMY WITH MYRINGOTOMY N/A 1993   Social History   Social History Narrative   Not on file   Immunization History  Administered Date(s) Administered   Influenza, Quadrivalent, Recombinant, Inj, Pf 06/10/2019, 06/12/2020   Influenza,inj,Quad PF,6+ Mos 06/16/2014, 05/24/2016   Influenza-Unspecified 09/19/2015   PFIZER(Purple Top)SARS-COV-2 Vaccination 11/19/2019, 12/15/2019, 08/28/2020     Objective: Vital Signs: BP 131/82 (BP Location: Left Arm, Patient Position: Sitting, Cuff Size: Large)   Pulse 92   Resp 14   Ht 5\' 3"  (1.6 m)   Wt 216 lb 6.4 oz (98.2 kg)   BMI 38.33 kg/m    Physical Exam Constitutional:      Appearance: She is obese.  Cardiovascular:     Rate and Rhythm: Normal rate and regular rhythm.  Pulmonary:     Effort: Pulmonary effort is normal.     Breath sounds: Normal breath sounds.  Musculoskeletal:     Right lower leg: No edema.     Left lower leg: No edema.  Skin:    General: Skin is warm and dry.     Findings: No rash.  Neurological:     Mental Status: She is alert.  Psychiatric:        Mood and Affect: Mood normal.      Musculoskeletal Exam:  Shoulders full ROM, right shoulder pain with full overhead abduction, and external rotation, mild tenderness lateral and anterior no appreciable swelling Elbows slightly decreased extension ROM, no palpable swelling Right wrist decreased ROM, heberdon's nodes and joint widening throughout all fingers, decreased flexion ROM, soft tissue swelling or thickening palpable over PIPs worse on right Right knee decreased extension and flexion ROM, left knee full ROM, no palpable effusion or laxity Ankles full ROM no tenderness or  swelling   Investigation: No additional findings.  Imaging: XR Shoulder Right  Result Date: 04/12/2022 X-ray right shoulder 4 views Glenohumeral joint space appears normal.  Slight narrowing of AC joint space.  There are small lateral calcifications and some probable small enthesophytes at superior border of acromion process.  Normal internal and external rotation.  No visible soft tissue swelling. Impression Mild degenerative changes at [facility] joint and some probable chronic changes on superior border of acromion  XR Hand 2 View Left  Result Date: 04/12/2022 X-ray left hand 2 views Radiocarpal joint space appears normal.  First CMC joint is severely damaged with near total subluxation and several synovial or periarticular calcifications present.  MCP joint spaces appear normal.  DIP joints with minimal joint space loss but probable early lateral osteophytes and some periarticular cyst.  No erosions seen and bone mineralization appears normal. Impression Severe first CMC joint osteoarthritis with mild distal joint changes  XR Hand 2 View Right  Result Date: 04/12/2022 X-ray right hand 2 views Radiocarpal joint space appears normal.  Moderate first CMC degenerative changes with mild subluxation.  MCP joints appear normal.  DIP joints with joint space loss multiple cystic changes and osteophytes third digit with lateral periarticular calcification.  No erosions seen and bone mineralization appears normal. Impression Moderately advanced osteoarthritis changes in the thumb and DIP joints with numerous cystic changes   Recent Labs: No results found for: "WBC", "HGB", "PLT", "NA", "K", "CL", "CO2", "GLUCOSE", "BUN", "CREATININE", "BILITOT", "ALKPHOS", "AST", "ALT", "PROT", "ALBUMIN", "CALCIUM", "GFRAA", "QFTBGOLD", "QFTBGOLDPLUS"  Speciality Comments: No specialty comments available.  Procedures:  No procedures performed Allergies: Bacitracin, Neomycin, Bacitracin-polymyxin b, and Sulfa antibiotics    Assessment / Plan:     Visit Diagnoses: Elevated sed rate Bilateral hand pain -  - Plan: Sedimentation rate, C-reactive protein, Cyclic citrul peptide antibody, IgG, ANA,: XR Hand 2 View Right, XR Hand 2 View Left, ANA  Not much inflammatory change apparent on physical exam today she does have definite osteoarthritis of multiple joints including both hands.  May be developing some triggering possible flexor tendinitis versus very early contracture.  We will repeat sedimentation rate test also checking CRP and CCP also ANA titer and pattern.  X-ray of bilateral hands demonstrates degenerative appearing changes most advanced in the first CMC joints and distal fingers consistent with osteoarthritis.  If work-up is unremarkable but still having complaints may benefit with clinic ultrasound exam for mild soft tissue changes.  Chronic right shoulder pain - Plan: XR Shoulder Right  Also having issues with the right shoulder and elbow range of motion is preserved on exam and no palpable swelling or warmth.  X-ray of the shoulder showing very mild changes and no significant glenohumeral osteoarthritis.  Orders: Orders Placed This Encounter  Procedures   XR Hand 2 View Right   XR Hand 2 View Left   XR Shoulder Right   Sedimentation rate   C-reactive protein   Cyclic citrul peptide antibody, IgG   ANA   ANA   No orders of the defined types were placed in this encounter.    Follow-Up Instructions: Return in about 2 weeks (around 04/26/2022) for Arthritis/?inflammatory f/u 2-3wks.   Fuller Plan, MD  Note - This record has been created using [employer].  Chart creation errors have been sought, but may not always  have been located. Such creation errors do not reflect on  the standard of medical care.

## 2022-04-12 ENCOUNTER — Ambulatory Visit (INDEPENDENT_AMBULATORY_CARE_PROVIDER_SITE_OTHER): Payer: 59

## 2022-04-12 ENCOUNTER — Encounter: Payer: Self-pay | Admitting: Internal Medicine

## 2022-04-12 ENCOUNTER — Ambulatory Visit: Payer: 59 | Attending: Internal Medicine | Admitting: Internal Medicine

## 2022-04-12 VITALS — BP 131/82 | HR 92 | Resp 14 | Ht 63.0 in | Wt 216.4 lb

## 2022-04-12 DIAGNOSIS — M79642 Pain in left hand: Secondary | ICD-10-CM

## 2022-04-12 DIAGNOSIS — M25511 Pain in right shoulder: Secondary | ICD-10-CM

## 2022-04-12 DIAGNOSIS — M79641 Pain in right hand: Secondary | ICD-10-CM

## 2022-04-12 DIAGNOSIS — G8929 Other chronic pain: Secondary | ICD-10-CM | POA: Diagnosis not present

## 2022-04-12 DIAGNOSIS — R700 Elevated erythrocyte sedimentation rate: Secondary | ICD-10-CM | POA: Diagnosis not present

## 2022-04-14 LAB — SEDIMENTATION RATE: Sed Rate: 19 mm/h (ref 0–30)

## 2022-04-14 LAB — CYCLIC CITRUL PEPTIDE ANTIBODY, IGG: Cyclic Citrullin Peptide Ab: <16 UNITS

## 2022-04-14 LAB — C-REACTIVE PROTEIN: CRP: 5.6 mg/L (ref <8.0)

## 2022-04-14 LAB — ANTI-NUCLEAR AB-TITER (ANA TITER): ANA Titer 1: 1:1280 {titer} — ABNORMAL HIGH

## 2022-04-14 LAB — ANA: Anti Nuclear Antibody (ANA): POSITIVE — AB

## 2022-04-17 ENCOUNTER — Other Ambulatory Visit (HOSPITAL_COMMUNITY): Payer: Self-pay

## 2022-04-18 NOTE — Progress Notes (Signed)
Office Visit Note  Patient: Joyce Watts             Date of Birth: [birth year]           MRN: [id number]             PCP: Shirline Frees, MD Referring: Shirline Frees, MD Visit Date: 04/29/2022   Subjective:  Follow-up (Stiffness in joints, feet swollen. Joint pain bilateral knees and hands. )   History of Present Illness: Joyce Watts is a 61 y.o. female here for follow up for evaluation of joint pain in multiple areas especially with concern of increasing stiffness and intermittent swelling in the bilateral hands. She saw Dr. Veverly Fells for follow up and planning to schedule right knee replacement once approved through insurance. Joint pains in hands remains worst around the base of the thumb area. She bought some OTC eye drops for dryness. Also notices some persistent eye swelling or irritation after styling eye lashes.  Previous HPI 04/12/2022 Joyce Watts is a 61 y.o. female here for evaluation of joint pain in multiple areas especially with concern of increasing stiffness and intermittent swelling in the bilateral hands.  She has longstanding history of knee osteoarthritis has had steroid and viscosupplementation injections with Dr. Veverly Fells.  Also has some chronic joint pain and frequent popping in her shoulders and elbows and hands but this has worsened.  She had bilateral carpal tunnel release surgery last year due to progressive worsening of sensory changes and had a good relief of symptoms after these.  However in about the past 6 months she has been experiencing worsening pain and stiffness in both hands also in the right shoulder and elbow.  She notices intermittent swelling or puffiness throughout the hands often lasting up to 1 or 2 hours daily.  She tried taking meloxicam for inflammation with no appreciable benefit although switching to twice daily naproxen she has seen partial improvement in symptoms when taking this.  She notices locking up sensation pretty frequently  throughout the right arm and in fingers of both hands.  The locking up of fingers is often most severe at night or first thing in the morning.  She had some laboratory testing for this with a elevated sedimentation rate. Besides her joint pains she does feel some persistent fatigue and gets short of breath more easily with activity than normal for her.  She has chronic dryness of eyes and mouth.  Does not take any specific medications for this.   Activities of Daily Living:  Patient reports morning stiffness for 1-2 hours.   Patient Reports nocturnal pain.  Difficulty dressing/grooming: Denies Difficulty climbing stairs: Reports Difficulty getting out of chair: Reports Difficulty using hands for taps, buttons, cutlery, and/or writing: Denies   Review of Systems  Constitutional:  Negative for fatigue.  HENT:  Positive for mouth sores. Negative for mouth dryness.   Eyes:  Positive for dryness.  Respiratory:  Negative for shortness of breath.   Cardiovascular:  Negative for chest pain and palpitations.  Gastrointestinal:  Negative for blood in stool, constipation and diarrhea.  Endocrine: Negative for increased urination.  Genitourinary:  Negative for involuntary urination.  Musculoskeletal:  Positive for joint pain, gait problem, joint pain, joint swelling and morning stiffness. Negative for myalgias, muscle weakness, muscle tenderness and myalgias.  Skin:  Positive for rash. Negative for color change, hair loss and sensitivity to sunlight.  Allergic/Immunologic: Negative for susceptible to infections.  Neurological:  Negative for dizziness and headaches.  Hematological:  Positive for swollen glands.  Psychiatric/Behavioral:  Positive for depressed mood. Negative for sleep disturbance. The patient is not nervous/anxious.     PMFS History:  Patient Active Problem List   Diagnosis Date Noted   High risk medication use 04/29/2022   Bilateral hand pain 04/12/2022   Pain in right shoulder  04/12/2022   Elevated sed rate 04/12/2022   Other malaise and fatigue 04/21/2014   Secondary hypothyroidism 04/21/2014   Left thyroid nodule 04/21/2014    Past Medical History:  Diagnosis Date   Anxiety    Asthma    Carpal tunnel syndrome    Depression    Hyperlipidemia    LDL in 6/15 = 133   Hypothyroidism    Dr. Lucianne Muss   Migraines    Past history    Family History  Problem Relation Age of Onset   Dementia Mother    High Cholesterol Mother    Hypertension Father    Diabetes Father    Bladder Cancer Father    Myasthenia gravis Father    Diabetes Maternal Grandfather    Cancer Paternal Grandmother    Diabetes Paternal Grandfather    Thyroid disease Neg Hx    Past Surgical History:  Procedure Laterality Date   CARPAL TUNNEL RELEASE Right 01/24/2021   CARPAL TUNNEL RELEASE Left 02/12/2021   SPINAL CORD DECOMPRESSION  09/05/1992   C1   TONSILECTOMY/ADENOIDECTOMY WITH MYRINGOTOMY N/A 1993   Social History   Social History Narrative   Not on file   Immunization History  Administered Date(s) Administered   Influenza, Quadrivalent, Recombinant, Inj, Pf 06/10/2019, 06/12/2020   Influenza,inj,Quad PF,6+ Mos 06/16/2014, 05/24/2016   Influenza-Unspecified 09/19/2015   PFIZER(Purple Top)SARS-COV-2 Vaccination 11/19/2019, 12/15/2019, 08/28/2020     Objective: Vital Signs: BP 136/82 (BP Location: Left Arm, Patient Position: Sitting, Cuff Size: Large)   Pulse 86   Resp 15   Ht 5\' 3"  (1.6 m)   Wt 215 lb (97.5 kg)   BMI 38.09 kg/m    Physical Exam Constitutional:      Appearance: She is obese.  Cardiovascular:     Rate and Rhythm: Normal rate and regular rhythm.  Pulmonary:     Effort: Pulmonary effort is normal.     Breath sounds: Normal breath sounds.  Skin:    General: Skin is warm and dry.     Findings: No rash.  Neurological:     Mental Status: She is alert.  Psychiatric:        Mood and Affect: Mood normal.      Musculoskeletal Exam:  Elbows full  ROM no tenderness or swelling Wrists full ROM mild tenderness on right Fingers with heberdon's nodes PIP and DIP joints, slightly decreased flexion ROM worse on right Right knee restricted flexion and extension, left knee full ROM, no palpable effusions   Investigation: No additional findings.  Imaging: XR Shoulder Right  Result Date: 04/12/2022 X-ray right shoulder 4 views Glenohumeral joint space appears normal.  Slight narrowing of AC joint space.  There are small lateral calcifications and some probable small enthesophytes at superior border of acromion process.  Normal internal and external rotation.  No visible soft tissue swelling. Impression Mild degenerative changes at [facility] joint and some probable chronic changes on superior border of acromion  XR Hand 2 View Left  Result Date: 04/12/2022 X-ray left hand 2 views Radiocarpal joint space appears normal.  First CMC joint is severely damaged with near total subluxation and several synovial or periarticular calcifications  present.  MCP joint spaces appear normal.  DIP joints with minimal joint space loss but probable early lateral osteophytes and some periarticular cyst.  No erosions seen and bone mineralization appears normal. Impression Severe first CMC joint osteoarthritis with mild distal joint changes  XR Hand 2 View Right  Result Date: 04/12/2022 X-ray right hand 2 views Radiocarpal joint space appears normal.  Moderate first CMC degenerative changes with mild subluxation.  MCP joints appear normal.  DIP joints with joint space loss multiple cystic changes and osteophytes third digit with lateral periarticular calcification.  No erosions seen and bone mineralization appears normal. Impression Moderately advanced osteoarthritis changes in the thumb and DIP joints with numerous cystic changes   Recent Labs: No results found for: "WBC", "HGB", "PLT", "NA", "K", "CL", "CO2", "GLUCOSE", "BUN", "CREATININE", "BILITOT", "ALKPHOS", "AST",  "ALT", "PROT", "ALBUMIN", "CALCIUM", "GFRAA", "QFTBGOLD", "QFTBGOLDPLUS"  Speciality Comments: No specialty comments available.  Procedures:  No procedures performed Allergies: Bacitracin, Neomycin, Bacitracin-polymyxin b, and Sulfa antibiotics   Assessment / Plan:     Visit Diagnoses: Sjogren's syndrome with other organ involvement (HCC) - Plan: Sjogrens syndrome-A extractable nuclear antibody, Sjogrens syndrome-B extractable nuclear antibody, Anti-DNA antibody, double-stranded, C3 and C4, Protein Electrophoresis, (serum)  Symptoms and findings now look most consistent with likely primary Sjogren's syndrome.  Very high ANA titer we will check specific antibody markers SSA SSB double-stranded DNA and serum complement.  We will also check SPEP for screening.  Gust initial conservative management of dryness symptoms with artificial tears treatments and use of sugar-free oral secretagogues or Biotene.   High risk medication use  Discussed disease specific treatments would involve immunomodulatory medications she is anticipating scheduling upcoming right knee replacement surgery due to severe osteoarthritis.  I do not see any erosive disease or urgent concerns that would pose a immediate harm to just delay new medication start for now.   Orders: Orders Placed This Encounter  Procedures   Sjogrens syndrome-A extractable nuclear antibody   Sjogrens syndrome-B extractable nuclear antibody   Anti-DNA antibody, double-stranded   C3 and C4   Protein Electrophoresis, (serum)   No orders of the defined types were placed in this encounter.    Follow-Up Instructions: Return in about 3 months (around 07/30/2022) for new pSS f/u 61mos.   Fuller Plan, MD  Note - This record has been created using [employer].  Chart creation errors have been sought, but may not always  have been located. Such creation errors do not reflect on  the standard of medical care.

## 2022-04-24 ENCOUNTER — Other Ambulatory Visit (HOSPITAL_COMMUNITY): Payer: Self-pay

## 2022-04-26 ENCOUNTER — Other Ambulatory Visit (HOSPITAL_COMMUNITY): Payer: Self-pay

## 2022-04-29 ENCOUNTER — Encounter: Payer: Self-pay | Admitting: Internal Medicine

## 2022-04-29 ENCOUNTER — Ambulatory Visit: Payer: 59 | Attending: Internal Medicine | Admitting: Internal Medicine

## 2022-04-29 VITALS — BP 136/82 | HR 86 | Resp 15 | Ht 63.0 in | Wt 215.0 lb

## 2022-04-29 DIAGNOSIS — M3509 Sicca syndrome with other organ involvement: Secondary | ICD-10-CM

## 2022-04-29 DIAGNOSIS — G8929 Other chronic pain: Secondary | ICD-10-CM

## 2022-04-29 DIAGNOSIS — Z79899 Other long term (current) drug therapy: Secondary | ICD-10-CM | POA: Diagnosis not present

## 2022-04-29 DIAGNOSIS — M25511 Pain in right shoulder: Secondary | ICD-10-CM | POA: Diagnosis not present

## 2022-04-29 DIAGNOSIS — M3500 Sicca syndrome, unspecified: Secondary | ICD-10-CM | POA: Insufficient documentation

## 2022-04-29 DIAGNOSIS — R700 Elevated erythrocyte sedimentation rate: Secondary | ICD-10-CM

## 2022-05-01 LAB — SJOGRENS SYNDROME-B EXTRACTABLE NUCLEAR ANTIBODY: SSB (La) (ENA) Antibody, IgG: <1 AI

## 2022-05-01 LAB — C3 AND C4
C3 Complement: 156 mg/dL (ref 83–193)
C4 Complement: 38 mg/dL (ref 15–57)

## 2022-05-01 LAB — SJOGRENS SYNDROME-A EXTRACTABLE NUCLEAR ANTIBODY: SSA (Ro) (ENA) Antibody, IgG: <1 AI

## 2022-05-01 LAB — PROTEIN ELECTROPHORESIS, SERUM
Albumin ELP: 3.5 g/dL — ABNORMAL LOW (ref 3.8–4.8)
Alpha 1: 0.4 g/dL — ABNORMAL HIGH (ref 0.2–0.3)
Alpha 2: 0.9 g/dL (ref 0.5–0.9)
Beta 2: 0.3 g/dL (ref 0.2–0.5)
Beta Globulin: 0.4 g/dL (ref 0.4–0.6)
Gamma Globulin: 0.6 g/dL — ABNORMAL LOW (ref 0.8–1.7)
Total Protein: 6.1 g/dL (ref 6.1–8.1)

## 2022-05-01 LAB — ANTI-DNA ANTIBODY, DOUBLE-STRANDED: ds DNA Ab: <1 IU/mL

## 2022-05-02 NOTE — Progress Notes (Signed)
The specific antibody markers for Sjogren's syndrome were negative.  Also screening test for malignancy risk were fine.  I still think it could be reasonable to consider trial of medication for inflammatory process in the future we will hold off for now until follow-up or whenever after she has her planned surgery.

## 2022-05-14 ENCOUNTER — Other Ambulatory Visit (HOSPITAL_COMMUNITY): Payer: Self-pay

## 2022-05-14 MED ORDER — ALBUTEROL SULFATE HFA 108 (90 BASE) MCG/ACT IN AERS
INHALATION_SPRAY | RESPIRATORY_TRACT | 1 refills | Status: DC
  Filled 2022-05-14: qty 6.7, 33d supply, fill #0
  Filled 2022-11-18: qty 6.7, 33d supply, fill #1

## 2022-05-16 ENCOUNTER — Encounter: Payer: Self-pay | Admitting: Internal Medicine

## 2022-05-16 DIAGNOSIS — K1379 Other lesions of oral mucosa: Secondary | ICD-10-CM

## 2022-05-23 ENCOUNTER — Other Ambulatory Visit (HOSPITAL_COMMUNITY): Payer: Self-pay

## 2022-05-23 MED ORDER — LIDOCAINE VISCOUS HCL 2 % MT SOLN
5.0000 mL | Freq: Four times a day (QID) | OROMUCOSAL | 0 refills | Status: DC | PRN
  Filled 2022-05-23: qty 240, 12d supply, fill #0

## 2022-05-24 ENCOUNTER — Other Ambulatory Visit (HOSPITAL_COMMUNITY): Payer: Self-pay

## 2022-06-11 ENCOUNTER — Other Ambulatory Visit: Payer: Self-pay | Admitting: Endocrinology

## 2022-06-11 ENCOUNTER — Other Ambulatory Visit (HOSPITAL_COMMUNITY): Payer: Self-pay

## 2022-06-11 MED ORDER — LEVOTHYROXINE SODIUM 75 MCG PO TABS
75.0000 ug | ORAL_TABLET | Freq: Every day | ORAL | 3 refills | Status: DC
  Filled 2022-06-11: qty 90, 90d supply, fill #0
  Filled 2022-09-25: qty 90, 90d supply, fill #1
  Filled 2023-01-06: qty 90, 90d supply, fill #2
  Filled 2023-03-24: qty 90, 90d supply, fill #3

## 2022-06-13 ENCOUNTER — Other Ambulatory Visit (HOSPITAL_COMMUNITY): Payer: Self-pay

## 2022-06-18 ENCOUNTER — Encounter: Payer: Self-pay | Admitting: Endocrinology

## 2022-07-23 ENCOUNTER — Other Ambulatory Visit (HOSPITAL_COMMUNITY): Payer: Self-pay

## 2022-07-24 ENCOUNTER — Other Ambulatory Visit (HOSPITAL_COMMUNITY): Payer: Self-pay

## 2022-07-24 MED ORDER — BUPROPION HCL ER (XL) 300 MG PO TB24
300.0000 mg | ORAL_TABLET | ORAL | 1 refills | Status: DC
  Filled 2022-07-24: qty 90, 90d supply, fill #0
  Filled 2022-10-17: qty 90, 90d supply, fill #1

## 2022-07-24 MED ORDER — HYDROCHLOROTHIAZIDE 25 MG PO TABS
25.0000 mg | ORAL_TABLET | Freq: Every morning | ORAL | 1 refills | Status: DC
  Filled 2022-07-24: qty 90, 90d supply, fill #0

## 2022-07-29 ENCOUNTER — Other Ambulatory Visit (HOSPITAL_COMMUNITY): Payer: Self-pay

## 2022-07-29 MED ORDER — ONDANSETRON HCL 8 MG PO TABS
8.0000 mg | ORAL_TABLET | Freq: Four times a day (QID) | ORAL | 0 refills | Status: DC | PRN
  Filled 2022-07-29: qty 18, 5d supply, fill #0
  Filled 2022-08-16: qty 18, 5d supply, fill #1

## 2022-07-29 MED ORDER — METHOCARBAMOL 500 MG PO TABS
500.0000 mg | ORAL_TABLET | Freq: Four times a day (QID) | ORAL | 1 refills | Status: DC | PRN
  Filled 2022-07-29: qty 60, 15d supply, fill #0
  Filled 2022-08-17: qty 60, 15d supply, fill #1

## 2022-07-29 MED ORDER — ASPIRIN 81 MG PO CHEW
81.0000 mg | CHEWABLE_TABLET | Freq: Two times a day (BID) | ORAL | 0 refills | Status: AC
  Filled 2022-07-29: qty 60, 30d supply, fill #0

## 2022-07-29 MED ORDER — OXYCODONE-ACETAMINOPHEN 5-325 MG PO TABS
1.0000 | ORAL_TABLET | ORAL | 0 refills | Status: DC | PRN
  Filled 2022-07-29: qty 40, 7d supply, fill #0

## 2022-07-30 ENCOUNTER — Ambulatory Visit: Payer: 59 | Admitting: Internal Medicine

## 2022-08-02 ENCOUNTER — Other Ambulatory Visit: Payer: 59

## 2022-08-05 ENCOUNTER — Other Ambulatory Visit (HOSPITAL_COMMUNITY): Payer: Self-pay

## 2022-08-06 ENCOUNTER — Other Ambulatory Visit (HOSPITAL_COMMUNITY): Payer: Self-pay

## 2022-08-06 MED ORDER — OXYCODONE-ACETAMINOPHEN 5-325 MG PO TABS
1.0000 | ORAL_TABLET | ORAL | 0 refills | Status: DC | PRN
  Filled 2022-08-06: qty 40, 7d supply, fill #0

## 2022-08-09 ENCOUNTER — Telehealth: Payer: 59 | Admitting: Endocrinology

## 2022-08-13 ENCOUNTER — Other Ambulatory Visit (HOSPITAL_COMMUNITY): Payer: Self-pay

## 2022-08-13 MED ORDER — OXYCODONE-ACETAMINOPHEN 5-325 MG PO TABS
1.0000 | ORAL_TABLET | ORAL | 0 refills | Status: DC | PRN
  Filled 2022-08-13: qty 40, 4d supply, fill #0

## 2022-08-17 ENCOUNTER — Other Ambulatory Visit (HOSPITAL_COMMUNITY): Payer: Self-pay

## 2022-08-17 MED ORDER — METHOCARBAMOL 500 MG PO TABS
500.0000 mg | ORAL_TABLET | Freq: Three times a day (TID) | ORAL | 1 refills | Status: DC | PRN
  Filled 2022-08-17: qty 60, 20d supply, fill #0
  Filled 2022-09-12: qty 60, 15d supply, fill #0
  Filled 2022-11-08: qty 60, 15d supply, fill #1

## 2022-08-19 ENCOUNTER — Other Ambulatory Visit (HOSPITAL_COMMUNITY): Payer: Self-pay

## 2022-08-19 MED ORDER — METHOCARBAMOL 500 MG PO TABS
ORAL_TABLET | ORAL | 1 refills | Status: DC
  Filled 2022-08-19: qty 60, 15d supply, fill #0

## 2022-08-19 MED ORDER — ONDANSETRON HCL 8 MG PO TABS
ORAL_TABLET | ORAL | 0 refills | Status: DC
  Filled 2022-08-19: qty 18, 21d supply, fill #0

## 2022-08-24 ENCOUNTER — Other Ambulatory Visit (HOSPITAL_COMMUNITY): Payer: Self-pay

## 2022-08-27 ENCOUNTER — Other Ambulatory Visit (HOSPITAL_COMMUNITY): Payer: Self-pay

## 2022-08-28 ENCOUNTER — Other Ambulatory Visit (HOSPITAL_COMMUNITY): Payer: Self-pay

## 2022-08-28 MED ORDER — METHOCARBAMOL 500 MG PO TABS
ORAL_TABLET | ORAL | 1 refills | Status: DC
  Filled 2022-08-28 – 2022-08-29 (×4): qty 40, 10d supply, fill #0

## 2022-08-28 MED ORDER — OXYCODONE-ACETAMINOPHEN 5-325 MG PO TABS
ORAL_TABLET | ORAL | 0 refills | Status: DC
  Filled 2022-08-28 (×2): qty 25, 4d supply, fill #0
  Filled 2022-08-28: qty 25, 5d supply, fill #0
  Filled 2022-08-29: qty 25, 4d supply, fill #0

## 2022-08-28 MED ORDER — OXYCODONE-ACETAMINOPHEN 5-325 MG PO TABS
1.0000 | ORAL_TABLET | ORAL | 0 refills | Status: DC | PRN
  Filled 2022-08-28: qty 40, 7d supply, fill #0

## 2022-08-29 ENCOUNTER — Other Ambulatory Visit (HOSPITAL_COMMUNITY): Payer: Self-pay

## 2022-08-29 ENCOUNTER — Other Ambulatory Visit: Payer: Self-pay

## 2022-09-02 NOTE — Progress Notes (Unsigned)
Office Visit Note  Patient: Joyce Watts             Date of Birth: [birth year]           MRN: [id number]             PCP: Shirline Frees, MD Referring: Shirline Frees, MD Visit Date: 09/04/2022   Subjective:  No chief complaint on file.   History of Present Illness: Joyce Watts is a 62 y.o. female here for follow up for sjogren syndrome and osteoarthritis. ***   Previous HPI 04/29/22 Joyce Watts is a 62 y.o. female here for follow up for evaluation of joint pain in multiple areas especially with concern of increasing stiffness and intermittent swelling in the bilateral hands. She saw Dr. Veverly Fells for follow up and planning to schedule right knee replacement once approved through insurance. Joint pains in hands remains worst around the base of the thumb area. She bought some OTC eye drops for dryness. Also notices some persistent eye swelling or irritation after styling eye lashes.   Previous HPI 04/12/2022 Joyce Watts is a 62 y.o. female here for evaluation of joint pain in multiple areas especially with concern of increasing stiffness and intermittent swelling in the bilateral hands.  She has longstanding history of knee osteoarthritis has had steroid and viscosupplementation injections with Dr. Veverly Fells.  Also has some chronic joint pain and frequent popping in her shoulders and elbows and hands but this has worsened.  She had bilateral carpal tunnel release surgery last year due to progressive worsening of sensory changes and had a good relief of symptoms after these.  However in about the past 6 months she has been experiencing worsening pain and stiffness in both hands also in the right shoulder and elbow.  She notices intermittent swelling or puffiness throughout the hands often lasting up to 1 or 2 hours daily.  She tried taking meloxicam for inflammation with no appreciable benefit although switching to twice daily naproxen she has seen partial improvement in symptoms when  taking this.  She notices locking up sensation pretty frequently throughout the right arm and in fingers of both hands.  The locking up of fingers is often most severe at night or first thing in the morning.  She had some laboratory testing for this with a elevated sedimentation rate. Besides her joint pains she does feel some persistent fatigue and gets short of breath more easily with activity than normal for her.  She has chronic dryness of eyes and mouth.  Does not take any specific medications for this.   No Rheumatology ROS completed.   PMFS History:  Patient Active Problem List   Diagnosis Date Noted   High risk medication use 04/29/2022   Sjogren's disease ([place]) 04/29/2022   Bilateral hand pain 04/12/2022   Pain in right shoulder 04/12/2022   Elevated sed rate 04/12/2022   Other malaise and fatigue 04/21/2014   Secondary hypothyroidism 04/21/2014   Left thyroid nodule 04/21/2014    Past Medical History:  Diagnosis Date   Anxiety    Asthma    Carpal tunnel syndrome    Depression    Hyperlipidemia    LDL in 6/15 = 133   Hypothyroidism    Dr. Dwyane Dee   Migraines    Past history    Family History  Problem Relation Age of Onset   Dementia Mother    High Cholesterol Mother    Hypertension Father    Diabetes Father  Bladder Cancer Father    Myasthenia gravis Father    Diabetes Maternal Grandfather    Cancer Paternal Grandmother    Diabetes Paternal Grandfather    Thyroid disease Neg Hx    Past Surgical History:  Procedure Laterality Date   CARPAL TUNNEL RELEASE Right 01/24/2021   CARPAL TUNNEL RELEASE Left 02/12/2021   SPINAL CORD DECOMPRESSION  09/05/1992   C1   TONSILECTOMY/ADENOIDECTOMY WITH MYRINGOTOMY N/A 1993   Social History   Social History Narrative   Not on file   Immunization History  Administered Date(s) Administered   Influenza, Quadrivalent, Recombinant, Inj, Pf 06/10/2019, 06/12/2020   Influenza,inj,Quad PF,6+ Mos 06/16/2014, 05/24/2016    Influenza-Unspecified 09/19/2015   PFIZER(Purple Top)SARS-COV-2 Vaccination 11/19/2019, 12/15/2019, 08/28/2020     Objective: Vital Signs: There were no vitals taken for this visit.   Physical Exam   Musculoskeletal Exam: ***  CDAI Exam: CDAI Score: -- Patient Global: --; Provider Global: -- Swollen: --; Tender: -- Joint Exam 09/04/2022   No joint exam has been documented for this visit   There is currently no information documented on the homunculus. Go to the Rheumatology activity and complete the homunculus joint exam.  Investigation: No additional findings.  Imaging: No results found.  Recent Labs: Lab Results  Component Value Date   PROT 6.1 04/29/2022    Speciality Comments: No specialty comments available.  Procedures:  No procedures performed Allergies: Bacitracin, Neomycin, Bacitracin-polymyxin b, and Sulfa antibiotics   Assessment / Plan:     Visit Diagnoses: No diagnosis found.  ***  Orders: No orders of the defined types were placed in this encounter.  No orders of the defined types were placed in this encounter.    Follow-Up Instructions: No follow-ups on file.   Collier Salina, MD  Note - This record has been created using [employer].  Chart creation errors have been sought, but may not always  have been located. Such creation errors do not reflect on  the standard of medical care.

## 2022-09-04 ENCOUNTER — Ambulatory Visit: Payer: 59 | Attending: Internal Medicine | Admitting: Internal Medicine

## 2022-09-04 ENCOUNTER — Encounter: Payer: Self-pay | Admitting: Internal Medicine

## 2022-09-04 ENCOUNTER — Other Ambulatory Visit (HOSPITAL_COMMUNITY): Payer: Self-pay

## 2022-09-04 VITALS — BP 129/76 | HR 83 | Resp 16 | Ht 62.0 in | Wt 194.0 lb

## 2022-09-04 DIAGNOSIS — M79641 Pain in right hand: Secondary | ICD-10-CM

## 2022-09-04 DIAGNOSIS — M79642 Pain in left hand: Secondary | ICD-10-CM

## 2022-09-04 DIAGNOSIS — M3509 Sicca syndrome with other organ involvement: Secondary | ICD-10-CM

## 2022-09-04 DIAGNOSIS — G8929 Other chronic pain: Secondary | ICD-10-CM

## 2022-09-04 DIAGNOSIS — M25511 Pain in right shoulder: Secondary | ICD-10-CM | POA: Diagnosis not present

## 2022-09-04 MED ORDER — LIDOCAINE HCL 1 % IJ SOLN
3.0000 mL | INTRAMUSCULAR | Status: AC | PRN
  Administered 2022-09-04: 3 mL

## 2022-09-04 MED ORDER — PILOCARPINE HCL 5 MG PO TABS
5.0000 mg | ORAL_TABLET | Freq: Two times a day (BID) | ORAL | 2 refills | Status: DC | PRN
  Filled 2022-09-04: qty 60, 30d supply, fill #0
  Filled 2022-11-18: qty 60, 30d supply, fill #1
  Filled 2023-01-06: qty 60, 30d supply, fill #2

## 2022-09-04 MED ORDER — TRIAMCINOLONE ACETONIDE 40 MG/ML IJ SUSP
40.0000 mg | INTRAMUSCULAR | Status: AC | PRN
  Administered 2022-09-04: 40 mg via INTRA_ARTICULAR

## 2022-09-07 IMAGING — US US ABDOMEN LIMITED RUQ/ASCITES
1 series · 14 of 25 positions shown · non-contrast
Comparison: None.

CLINICAL DATA: Right upper quadrant pain

EXAM:
ULTRASOUND ABDOMEN LIMITED RIGHT UPPER QUADRANT

[Series 1: us abdomen limited ruq/ascites · 0.15mm/px · 14 of 43 slices shown]
[im 1/43]
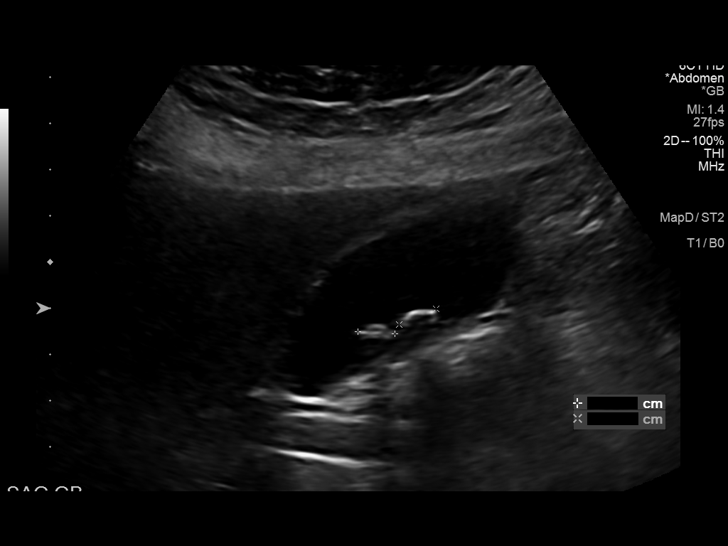
[im 4/43]
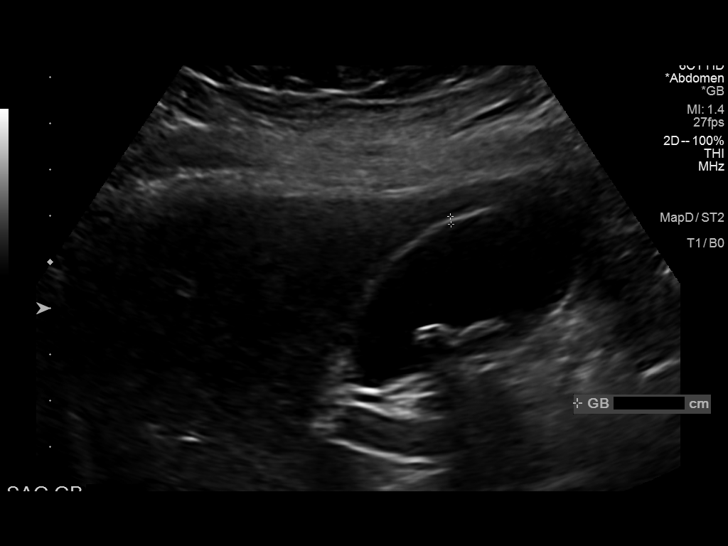
[im 8/43]
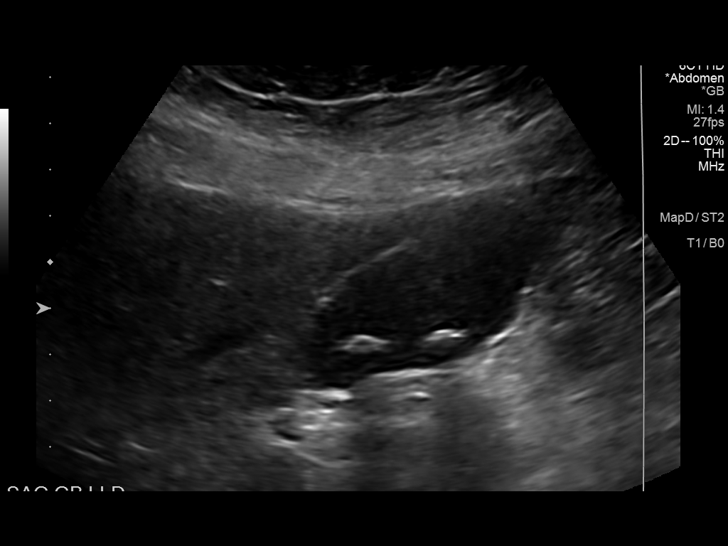
[im 11/43]
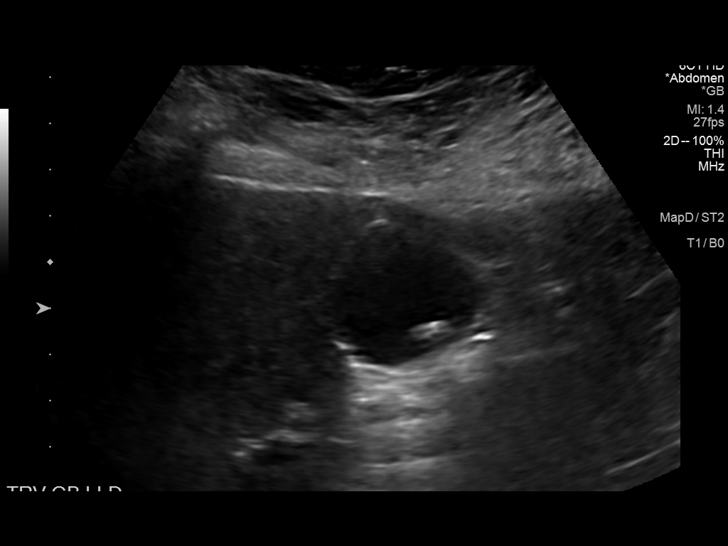
[im 15/43]
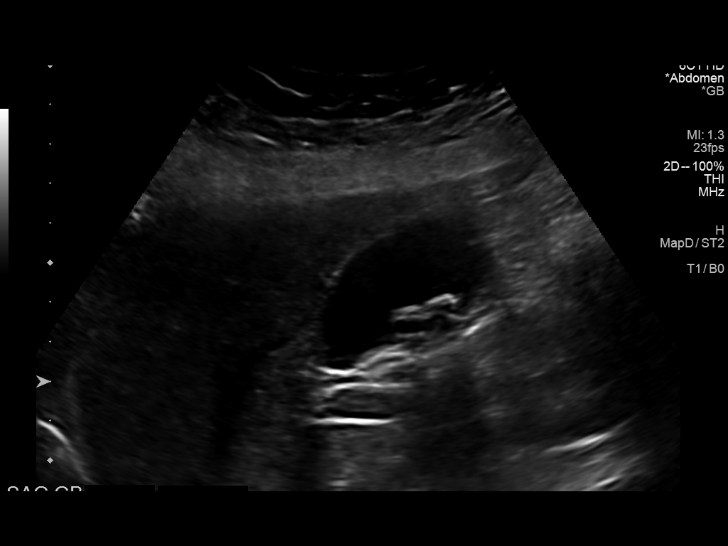
[im 16/43]
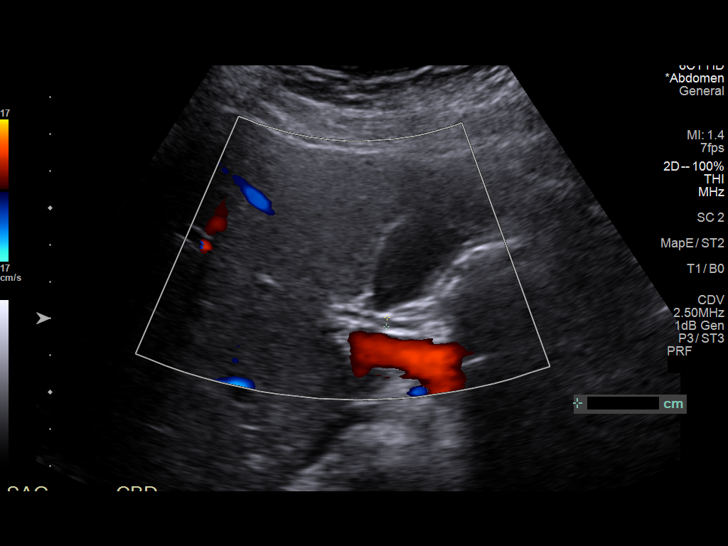
[im 20/43]
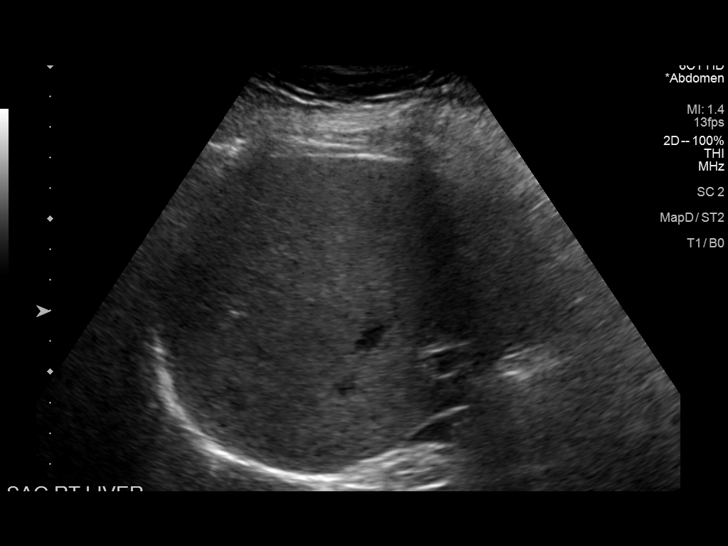
[im 23/43]
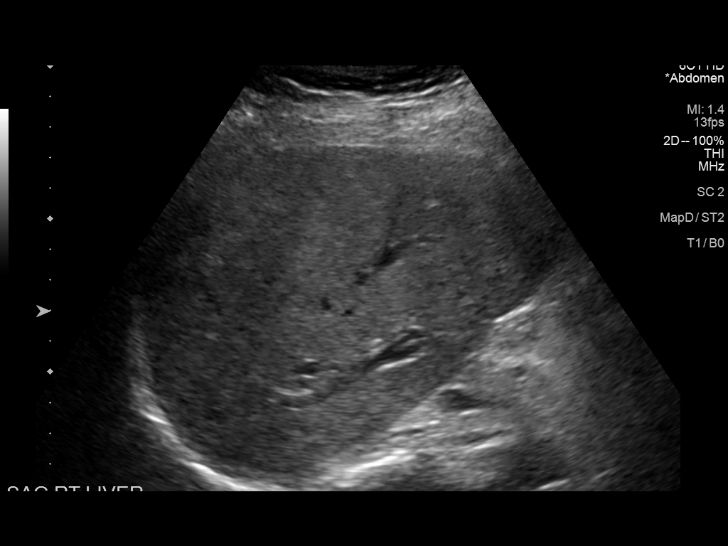
[im 27/43]
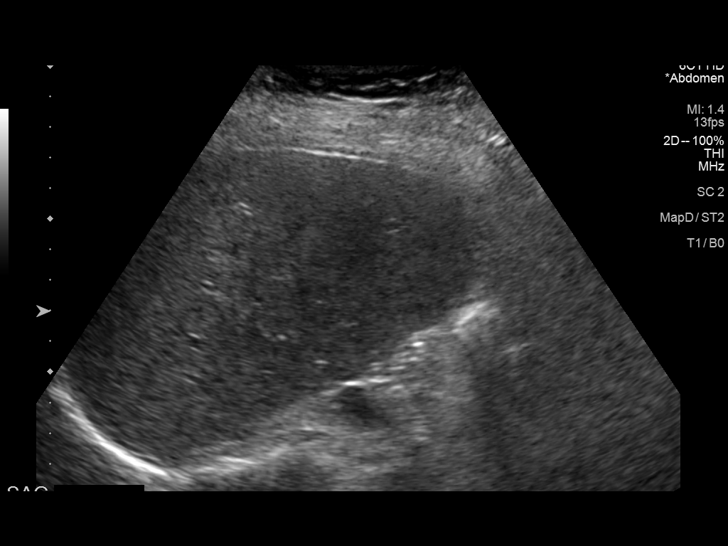
[im 29/43]
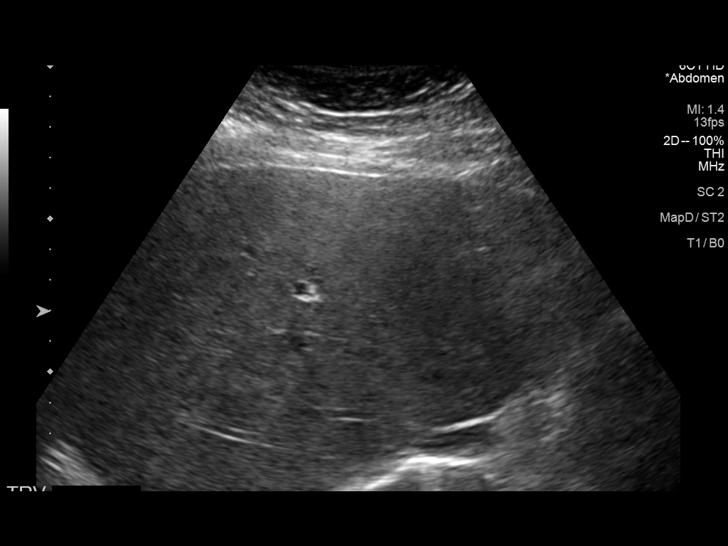
[im 32/43]
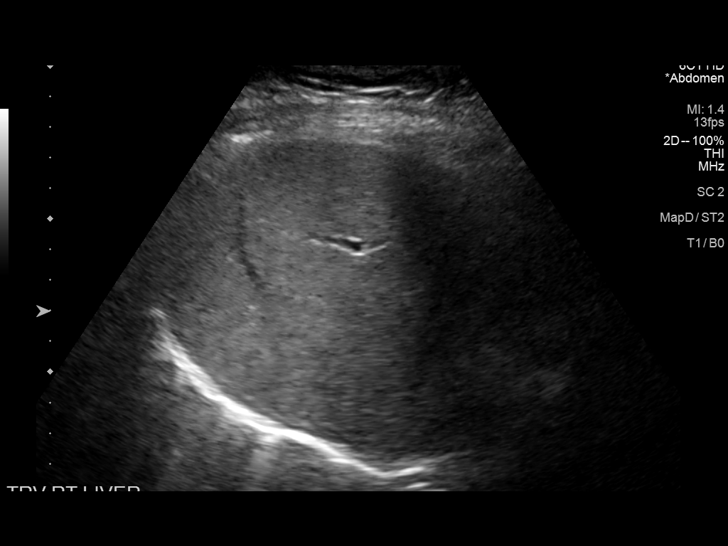
[im 36/43]
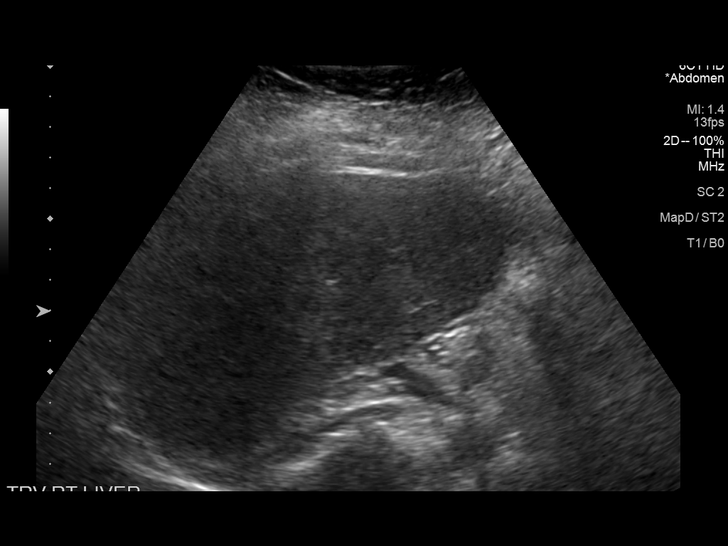
[im 39/43]
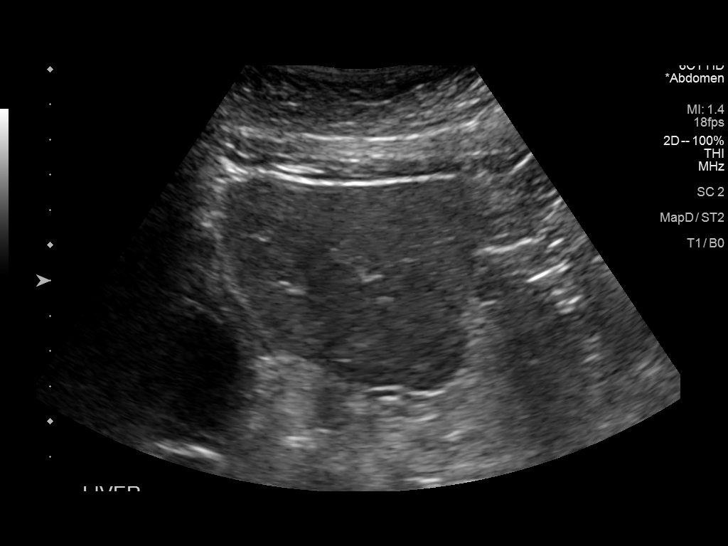
[im 43/43]
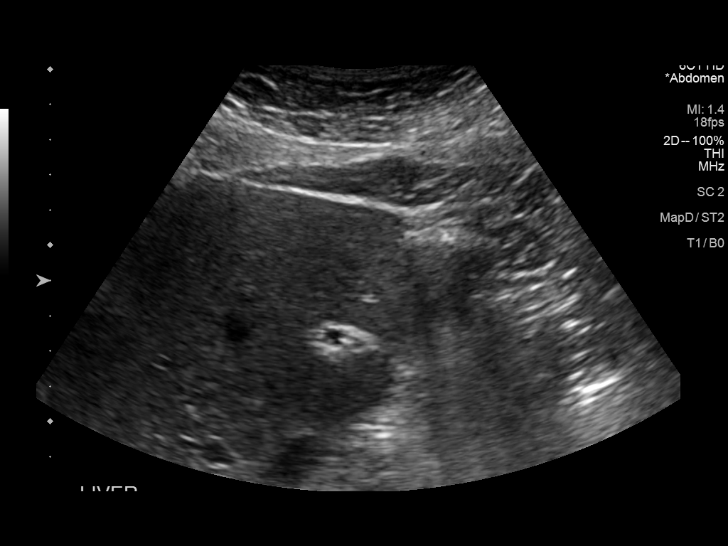

[14 of 25 positions shown; findings below may reference images not displayed]

FINDINGS: Gallbladder:

Mobile cholelithiasis measuring up to 8 mm. No pericholecystic fluid
or wall thickening visualized. No sonographic Murphy sign noted by
sonographer.

Common bile duct:

Diameter: 3 mm

Liver:

No focal lesion identified. Heterogeneously increased parenchymal
echogenicity. Portal vein is patent on color Doppler imaging with
normal direction of blood flow towards the liver.

Other: None.
IMPRESSION: 1. Cholelithiasis without sonographic evidence of acute
cholecystitis.
2. The echogenicity of the liver is heterogeneously increased. This
is a nonspecific finding but is most commonly seen with fatty
infiltration of the liver. There are no obvious focal liver lesions.

## 2022-09-13 ENCOUNTER — Other Ambulatory Visit (HOSPITAL_COMMUNITY): Payer: Self-pay

## 2022-10-18 ENCOUNTER — Other Ambulatory Visit (INDEPENDENT_AMBULATORY_CARE_PROVIDER_SITE_OTHER): Payer: 59

## 2022-10-18 DIAGNOSIS — E038 Other specified hypothyroidism: Secondary | ICD-10-CM | POA: Diagnosis not present

## 2022-10-18 LAB — TSH: TSH: 0.31 u[IU]/mL — ABNORMAL LOW (ref 0.35–5.50)

## 2022-10-18 LAB — T4, FREE: Free T4: 1.09 ng/dL (ref 0.60–1.60)

## 2022-10-24 ENCOUNTER — Ambulatory Visit: Payer: 59 | Admitting: Endocrinology

## 2022-10-25 ENCOUNTER — Ambulatory Visit (INDEPENDENT_AMBULATORY_CARE_PROVIDER_SITE_OTHER): Payer: 59 | Admitting: Endocrinology

## 2022-10-25 ENCOUNTER — Encounter: Payer: Self-pay | Admitting: Endocrinology

## 2022-10-25 VITALS — BP 136/82 | HR 90 | Ht 62.0 in | Wt 187.2 lb

## 2022-10-25 DIAGNOSIS — E041 Nontoxic single thyroid nodule: Secondary | ICD-10-CM | POA: Diagnosis not present

## 2022-10-25 DIAGNOSIS — E038 Other specified hypothyroidism: Secondary | ICD-10-CM

## 2022-10-25 NOTE — Progress Notes (Signed)
Patient ID: Joyce Watts, female   DOB: 05/29/1961, 62 y.o.   MRN: [id number]    Reason for Appointment: Follow-up of thyroid    History of Present Illness:   She was seen in evaluation for secondary hypothyroidism in 04/2014 At that time she was having symptoms of fatigue, weight gain, memory difficulties, brittle nails and some hair loss; symptoms had started in 5/15  She was found to have a low free T4 of 0.59 by her PCP and referred here for further management. TSH previously was normal She was evaluated for pituitary hypofunction and all the labs were normal including appropriate level of  [place] Initially after starting supplementation patient had improved her energy level as well as less issues with concentration and memory.  Recent history:  In 12/19 she was concerned about her nail splitting, hair loss, cold intolerance and difficulty focusing mentally Subsequently in 3/21 she was complaining of more fatigue than usual which was nonspecific She tends to have chronic coldness of her feet and some dry skin  She was taking 112 mcg of levothyroxine daily and this was reduced 10 08/2020 when she was having more symptoms of anxiety and her free T4 was 1.43  The dose was further reduced in 8/22 down to 75 mcg from 88 because of occasional symptoms of nervousness and also high normal free T3 level of 4.2  She has been taking 75 mcg levothyroxine  As before she takes this 30 minutes before breakfast daily  She feels fairly good overall Her only concern is hair loss for the last few months  Weight is continuing to improve after her knee surgery  She also takes Adderall to help her focus mentally better  Free T4 is about the same and her free T3 is usually normal   Previously has had suppressed TSH    Lab Results  Component Value Date   FREET4 1.09 10/18/2022   FREET4 1.08 02/05/2022   FREET4 1.05 08/07/2021   TSH 0.31 (L) 10/18/2022   TSH 0.01 (L) 02/05/2022   TSH  0.02 (L) 08/07/2021      Lab Results  Component Value Date   T3FREE 3.2 02/05/2022   T3FREE 3.6 08/07/2021   T3FREE 4.2 03/30/2021   T3FREE 3.8 09/21/2020   T3FREE 3.9 08/04/2020    Wt Readings from Last 3 Encounters:  10/25/22 187 lb 3.2 oz (84.9 kg)  09/04/22 194 lb (88 kg)  04/29/22 215 lb (97.5 kg)    PROBLEM 2:  Left thyroid nodule.   On her  initial exam in 2015 she was found to have a thyroid nodule on the left side and on ultrasound this was a solid nodule, measuring 4.8 cm  She will have occasional difficulty swallowing as before, she gets a feeling of food stopping the upper chest This may be slightly better with taking Pepcid as recommended by PCP  02/2020 ultrasound report: Nodule within the left thyroid is essentially unchanged in size from the comparison ultrasound in 2015  Thyroid biopsy showed benign follicular cells    Past Medical History:  Diagnosis Date   Anxiety    Asthma    Carpal tunnel syndrome    Depression    Hyperlipidemia    LDL in 6/15 = 133   Hypothyroidism    Dr. Dwyane Dee   Migraines    Past history    Past Surgical History:  Procedure Laterality Date   CARPAL TUNNEL RELEASE Right 01/24/2021   CARPAL TUNNEL RELEASE Left  02/12/2021   KNEE ARTHROPLASTY     REPLACEMENT TOTAL KNEE Right    SPINAL CORD DECOMPRESSION  09/05/1992   C1   TONSILECTOMY/ADENOIDECTOMY WITH MYRINGOTOMY N/A 1993    Family History  Problem Relation Age of Onset   Dementia Mother    High Cholesterol Mother    Hypertension Father    Diabetes Father    Bladder Cancer Father    Myasthenia gravis Father    Diabetes Maternal Grandfather    Cancer Paternal Grandmother    Diabetes Paternal Grandfather    Thyroid disease Neg Hx     Social History:  reports that she has never smoked. She has never been exposed to tobacco smoke. She has never used smokeless tobacco. She reports that she does not currently use alcohol. She reports that she does not use  drugs.  Allergies:  Allergies  Allergen Reactions   Bacitracin Other (See Comments)   Neomycin Other (See Comments)   Bacitracin-Polymyxin B Rash   Sulfa Antibiotics Rash    Allergies as of 10/25/2022       Reactions   Bacitracin Other (See Comments)   Neomycin Other (See Comments)   Bacitracin-polymyxin B Rash   Sulfa Antibiotics Rash        Medication List        Accurate as of October 25, 2022  8:40 AM. If you have any questions, ask your nurse or doctor.          ALPRAZolam 0.5 MG tablet Commonly known as: Xanax Take 1 tablet by mouth 3 times a day  as needed for anxiety for 30 days   amphetamine-dextroamphetamine 5 MG tablet Commonly known as: ADDERALL Take 5 mg by mouth daily as needed. Take 1-2 tablets (5-'10mg'$  total) by mouth once daily.   buPROPion 300 MG 24 hr tablet Commonly known as: WELLBUTRIN XL Take 1 tablet (300 mg total) by mouth every morning.   hydrochlorothiazide 25 MG tablet Commonly known as: HYDRODIURIL Take 1 tablet by mouth every morning.   levothyroxine 75 MCG tablet Commonly known as: SYNTHROID Take 1 tablet (75 mcg total) by mouth daily.   methocarbamol 500 MG tablet Commonly known as: ROBAXIN Take 1 tablet (500 mg total) by mouth every 6 to 8 hours as needed for spasms for 10 days.   mupirocin ointment 2 % Commonly known as: BACTROBAN Apply 1 application topically 3 times/day as needed-between meals & bedtime.   ondansetron 8 MG tablet Commonly known as: ZOFRAN Take 1 tablet (8 mg total) by mouth every 6 to 8 hours as needed.   oxyCODONE-acetaminophen 5-325 MG tablet Commonly known as: PERCOCET/ROXICET Take 1 - 2 tablets by mouth every 4 - 6 hours as needed (Max is 6 tabs a day)   pilocarpine 5 MG tablet Commonly known as: SALAGEN Take 1 tablet (5 mg total) by mouth 2 (two) times daily as needed.   rizatriptan 10 MG tablet Commonly known as: MAXALT Take 10 mg by mouth daily as needed for migraine. Take 1 tablet by  mouth once daily as needed.   triamcinolone cream 0.1 % Commonly known as: KENALOG   valACYclovir 1000 MG tablet Commonly known as: VALTREX Take 2 tablets by mouth twice a day x 1 day per episode   Ventolin HFA 108 (90 Base) MCG/ACT inhaler Generic drug: albuterol as needed.   albuterol 108 (90 Base) MCG/ACT inhaler Commonly known as: VENTOLIN HFA Inhale 1 puff into the lungs every 4 hours as needed  Review of Systems:  She is postmenopausal since about 2013.        Complaining of persistent hair loss since late last year    She takes HCTZ for edema as needed  from her PCP   No history of hypertension     Examination:    BP 136/82 (BP Location: Left Arm, Patient Position: Sitting, Cuff Size: Normal)   Pulse 90   Ht '5\' 2"'$  (1.575 m)   Wt 187 lb 3.2 oz (84.9 kg)   SpO2 97%   BMI 34.24 kg/m   Left thyroid nodule is smooth and firm, better felt on swallowing and around 2.5-cm in size Biceps reflexes show normal relaxation   Assessments/Plan   1.  Secondary Hypothyroidism with baseline low free T4 level and symptoms of fatigue, weight gain, memory difficulties and  hair loss She has had control of her symptoms with levothyroxine supplementation since about 2015  Has been on a steady dose of 75 mcg levothyroxine  She is complaining of any fatigue and no jitteriness Her only new complaint is hair loss and not clear if this is related to her significant weight loss and after surgery  Free T4 is consistently about the same TSH usually low  She will continue the same dose and follow-up in 12 months  2.  Benign left thyroid nodule from benign follicular adenoma: On exam about the same and no lymphadenopathy in the neck Previously had no change in size between 2015 and 2021 on ultrasound  Will continue to follow clinically  Elayne Snare 10/25/2022, 8:40 AM

## 2022-11-08 ENCOUNTER — Other Ambulatory Visit (HOSPITAL_COMMUNITY): Payer: Self-pay

## 2022-11-08 MED ORDER — AMOXICILLIN-POT CLAVULANATE 875-125 MG PO TABS
1.0000 | ORAL_TABLET | Freq: Two times a day (BID) | ORAL | 0 refills | Status: DC
  Filled 2022-11-08: qty 20, 10d supply, fill #0

## 2022-12-04 ENCOUNTER — Ambulatory Visit: Payer: 59 | Attending: Internal Medicine | Admitting: Internal Medicine

## 2022-12-04 ENCOUNTER — Encounter: Payer: Self-pay | Admitting: Internal Medicine

## 2022-12-04 VITALS — BP 136/76 | HR 90 | Resp 16 | Ht 62.5 in | Wt 188.0 lb

## 2022-12-04 DIAGNOSIS — M3509 Sicca syndrome with other organ involvement: Secondary | ICD-10-CM

## 2022-12-04 DIAGNOSIS — R700 Elevated erythrocyte sedimentation rate: Secondary | ICD-10-CM | POA: Diagnosis not present

## 2022-12-04 NOTE — Progress Notes (Signed)
Office Visit Note  Patient: Joyce Watts             Date of Birth: 05/05/1961           MRN: [id number]             PCP: Shirline Frees, MD Referring: Shirline Frees, MD Visit Date: 12/04/2022   Subjective:  Follow-up (Patient states her eyes get dry, start flaking, and are itchy. Patient states her eye problems have been going on for a month now.)   History of Present Illness: Joyce Watts is a 62 y.o. female here for follow up for Sjogren's syndrome and osteoarthritis.  At her last visit we did right shoulder steroid injection she felt a good improvement in symptoms and has stayed better since that time.  Getting a little bit of pain with some full overhead movement but much better than before.  Still has dry mouth symptoms she is taking the pilocarpine at nighttime which is helpful.  No new ulcers or lesions no thrush.  She recently took course of Augmentin for suspected bacterial sinus infection though is still having drainage problems again afterwards.  Thinks this may be more allergy related she does have history of environmental allergies.  Also with facial rashes that are itchy sometimes flaky around her eyes and face she is treating this with cetaphil and taking Allegra for allergies.  She is also noticed this rash can be more pronounced when stressed out.  She is very stressed and getting sleep deprivation when looking after her mother who is seriously ill and end-of-life.  Had a few more episodes with bluish finger discoloration but no associated lesions or skin peeling.  Taking Tylenol as needed for hand osteoarthritis not every day.  Previous HPI 09/04/22 Joyce Watts is a 62 y.o. female here for follow up for sjogren syndrome and osteoarthritis. She had right knee replacement surgery in November this was uneventful and has been progressing well with rehab so far. Flexion ROM is still improving. She is no longer taking any NSAIDs regularly, takes tylenol arthritis as needed  which is partially helpful. Her left thumb still hurts sometimes but worst area is the right shoulder. Still having dryness symptoms uses drops occasionally, her mouth is a bigger problem sometimes with burning discomfort and tried adding a humidifier in the bedroom.   Previous HPI 04/29/22 Joyce Watts is a 62 y.o. female here for follow up for evaluation of joint pain in multiple areas especially with concern of increasing stiffness and intermittent swelling in the bilateral hands. She saw Dr. Veverly Fells for follow up and planning to schedule right knee replacement once approved through insurance. Joint pains in hands remains worst around the base of the thumb area. She bought some OTC eye drops for dryness. Also notices some persistent eye swelling or irritation after styling eye lashes.   Previous HPI 04/12/2022 Joyce Watts is a 62 y.o. female here for evaluation of joint pain in multiple areas especially with concern of increasing stiffness and intermittent swelling in the bilateral hands.  She has longstanding history of knee osteoarthritis has had steroid and viscosupplementation injections with Dr. Veverly Fells.  Also has some chronic joint pain and frequent popping in her shoulders and elbows and hands but this has worsened.  She had bilateral carpal tunnel release surgery last year due to progressive worsening of sensory changes and had a good relief of symptoms after these.  However in about the past 6 months she  has been experiencing worsening pain and stiffness in both hands also in the right shoulder and elbow.  She notices intermittent swelling or puffiness throughout the hands often lasting up to 1 or 2 hours daily.  She tried taking meloxicam for inflammation with no appreciable benefit although switching to twice daily naproxen she has seen partial improvement in symptoms when taking this.  She notices locking up sensation pretty frequently throughout the right arm and in fingers of both hands.   The locking up of fingers is often most severe at night or first thing in the morning.  She had some laboratory testing for this with a elevated sedimentation rate. Besides her joint pains she does feel some persistent fatigue and gets short of breath more easily with activity than normal for her.  She has chronic dryness of eyes and mouth.  Does not take any specific medications for this.   Review of Systems  Constitutional:  Positive for fatigue.  HENT:  Positive for mouth dryness. Negative for mouth sores.   Eyes:  Positive for dryness.  Respiratory:  Positive for shortness of breath.   Cardiovascular:  Negative for chest pain and palpitations.  Gastrointestinal:  Negative for blood in stool, constipation and diarrhea.  Endocrine: Negative for increased urination.  Genitourinary:  Negative for involuntary urination.  Musculoskeletal:  Positive for joint pain, gait problem, joint pain, joint swelling, myalgias, morning stiffness and myalgias. Negative for muscle weakness and muscle tenderness.  Skin:  Positive for color change, hair loss and sensitivity to sunlight. Negative for rash.  Allergic/Immunologic: Negative for susceptible to infections.  Neurological:  Negative for dizziness and headaches.  Hematological:  Negative for swollen glands.  Psychiatric/Behavioral:  Negative for depressed mood and sleep disturbance. The patient is not nervous/anxious.     PMFS History:  Patient Active Problem List   Diagnosis Date Noted   High risk medication use 04/29/2022   Sjogren's disease 04/29/2022   Bilateral hand pain 04/12/2022   Pain in right shoulder 04/12/2022   Elevated sed rate 04/12/2022   Other malaise and fatigue 04/21/2014   Secondary hypothyroidism 04/21/2014   Left thyroid nodule 04/21/2014    Past Medical History:  Diagnosis Date   Anxiety    Asthma    Carpal tunnel syndrome    Depression    Hyperlipidemia    LDL in 6/15 = 133   Hypothyroidism    Dr. Dwyane Dee    Migraines    Past history    Family History  Problem Relation Age of Onset   Dementia Mother    High Cholesterol Mother    Hypertension Father    Diabetes Father    Bladder Cancer Father    Myasthenia gravis Father    Diabetes Maternal Grandfather    Cancer Paternal Grandmother    Diabetes Paternal Grandfather    Thyroid disease Neg Hx    Past Surgical History:  Procedure Laterality Date   CARPAL TUNNEL RELEASE Right 01/24/2021   CARPAL TUNNEL RELEASE Left 02/12/2021   KNEE ARTHROPLASTY     REPLACEMENT TOTAL KNEE Right    SPINAL CORD DECOMPRESSION  09/05/1992   C1   TONSILECTOMY/ADENOIDECTOMY WITH MYRINGOTOMY N/A 1993   Social History   Social History Narrative   Not on file   Immunization History  Administered Date(s) Administered   Influenza, Quadrivalent, Recombinant, Inj, Pf 06/10/2019, 06/12/2020   Influenza,inj,Quad PF,6+ Mos 06/16/2014, 05/24/2016   Influenza-Unspecified 09/19/2015   PFIZER(Purple Top)SARS-COV-2 Vaccination 11/19/2019, 12/15/2019, 08/28/2020     Objective:  Vital Signs: BP (!) 143/76 (BP Location: Left Arm, Patient Position: Sitting, Cuff Size: Normal)   Pulse 98   Resp 16   Ht 5' 2.5" (1.588 m)   Wt 188 lb (85.3 kg)   BMI 33.84 kg/m    Physical Exam Constitutional:      Appearance: She is obese.  HENT:     Mouth/Throat:     Mouth: Mucous membranes are dry.     Pharynx: Oropharynx is clear.     Comments: Flattening of lingual papillae Eyes:     Conjunctiva/sclera: Conjunctivae normal.  Cardiovascular:     Rate and Rhythm: Normal rate and regular rhythm.  Pulmonary:     Effort: Pulmonary effort is normal.     Breath sounds: Normal breath sounds.  Lymphadenopathy:     Cervical: No cervical adenopathy.  Skin:    General: Skin is warm and dry.     Findings: Rash present.     Comments: Facial rash flat and erythematous most around eyes, blanching, no papules, no edema  Neurological:     Mental Status: She is alert.  Psychiatric:         Mood and Affect: Mood normal.      Musculoskeletal Exam:  Neck full ROM no tenderness Right shoulder pain with full overhead ROM and external rotation, no swelling Elbows full ROM no tenderness or swelling Wrists full ROM no tenderness or swelling Fingers heberdon's nodes most prominent right 2nd DIP, mild tenderness to pressure base of thumb and 1st MCP Knees full ROM no tenderness or swelling   Investigation: No additional findings.  Imaging: No results found.  Recent Labs: Lab Results  Component Value Date   PROT 6.1 04/29/2022    Speciality Comments: No specialty comments available.  Procedures:  No procedures performed Allergies: Bacitracin, Neomycin, Bacitracin-polymyxin b, and Sulfa antibiotics   Assessment / Plan:     Visit Diagnoses: Sjogren's syndrome with other organ involvement Elevated sed rate  Dryness symptoms partially improved with symptomatic treatment pilocarpine 5 mg twice daily.  Current facial rashes I suspect is more allergy histamine mediated process than from Sjogren syndrome.  Recommend we can try addition of hydroxyzine if symptoms become more persistent or bothersome.  Osteoarthritis  Shoulder pain with persistent benefit after local steroid injection still mild soreness with full range of motion on exam today.  Symptomatic osteoarthritis of bilateral hands most advanced at first [facility] and DIP joints.  Orders: No orders of the defined types were placed in this encounter.  No orders of the defined types were placed in this encounter.    Follow-Up Instructions: Return in about 6 months (around 06/05/2023) for pSS/OA f/u 6mos.   Fuller Plan, MD  Note - This record has been created using [employer].  Chart creation errors have been sought, but may not always  have been located. Such creation errors do not reflect on  the standard of medical care.

## 2022-12-08 ENCOUNTER — Encounter: Payer: Self-pay | Admitting: Internal Medicine

## 2022-12-08 DIAGNOSIS — R21 Rash and other nonspecific skin eruption: Secondary | ICD-10-CM

## 2022-12-10 ENCOUNTER — Other Ambulatory Visit (HOSPITAL_COMMUNITY): Payer: Self-pay

## 2022-12-10 MED ORDER — HYDROXYZINE PAMOATE 25 MG PO CAPS
25.0000 mg | ORAL_CAPSULE | Freq: Three times a day (TID) | ORAL | 0 refills | Status: DC | PRN
Start: 2022-12-10 — End: 2023-02-25
  Filled 2022-12-10: qty 30, 10d supply, fill #0

## 2022-12-26 ENCOUNTER — Other Ambulatory Visit (HOSPITAL_COMMUNITY): Payer: Self-pay

## 2022-12-26 MED ORDER — PROMETHAZINE-DM 6.25-15 MG/5ML PO SYRP
5.0000 mL | ORAL_SOLUTION | Freq: Four times a day (QID) | ORAL | 1 refills | Status: DC
  Filled 2022-12-26: qty 150, 8d supply, fill #0

## 2022-12-27 ENCOUNTER — Other Ambulatory Visit (HOSPITAL_COMMUNITY): Payer: Self-pay

## 2023-01-07 ENCOUNTER — Other Ambulatory Visit (HOSPITAL_COMMUNITY): Payer: Self-pay

## 2023-01-07 MED ORDER — AZITHROMYCIN 250 MG PO TABS
ORAL_TABLET | ORAL | 0 refills | Status: DC
  Filled 2023-01-07: qty 6, 5d supply, fill #0

## 2023-01-23 ENCOUNTER — Other Ambulatory Visit (HOSPITAL_COMMUNITY): Payer: Self-pay

## 2023-01-23 MED ORDER — BUPROPION HCL ER (XL) 300 MG PO TB24
ORAL_TABLET | ORAL | 1 refills | Status: DC
  Filled 2023-01-23: qty 90, 90d supply, fill #0
  Filled 2023-04-29: qty 90, 90d supply, fill #1

## 2023-01-23 MED ORDER — HYDROCHLOROTHIAZIDE 25 MG PO TABS
ORAL_TABLET | ORAL | 1 refills | Status: DC
  Filled 2023-01-23: qty 90, 90d supply, fill #0
  Filled 2023-04-29: qty 90, 90d supply, fill #1

## 2023-02-11 ENCOUNTER — Other Ambulatory Visit: Payer: Self-pay | Admitting: Internal Medicine

## 2023-02-11 ENCOUNTER — Other Ambulatory Visit (HOSPITAL_COMMUNITY): Payer: Self-pay

## 2023-02-11 DIAGNOSIS — M3509 Sicca syndrome with other organ involvement: Secondary | ICD-10-CM

## 2023-02-11 MED ORDER — PILOCARPINE HCL 5 MG PO TABS
5.0000 mg | ORAL_TABLET | Freq: Two times a day (BID) | ORAL | 5 refills | Status: DC | PRN
Start: 2023-02-11 — End: 2023-08-29
  Filled 2023-02-11: qty 60, 30d supply, fill #0
  Filled 2023-03-24: qty 60, 30d supply, fill #1
  Filled 2023-04-29: qty 60, 30d supply, fill #2
  Filled 2023-06-06: qty 60, 30d supply, fill #3
  Filled 2023-07-07: qty 60, 30d supply, fill #4
  Filled 2023-08-16: qty 60, 30d supply, fill #5

## 2023-02-11 NOTE — Telephone Encounter (Signed)
Last Fill: 09/04/2022  Next Visit: 06/05/2023  Last Visit: 12/04/2022  Dx: Sjogren's syndrome with other organ involvement   Current Dose per office note on 12/04/2022: pilocarpine 5 mg twice daily   Okay to refill Pilocarpine?

## 2023-02-11 NOTE — Telephone Encounter (Signed)
Please forward to Dr. Rice.

## 2023-02-20 ENCOUNTER — Other Ambulatory Visit (HOSPITAL_COMMUNITY): Payer: Self-pay

## 2023-02-20 MED ORDER — ALPRAZOLAM 0.5 MG PO TABS
0.5000 mg | ORAL_TABLET | Freq: Three times a day (TID) | ORAL | 0 refills | Status: DC | PRN
  Filled 2023-02-20 – 2023-02-24 (×2): qty 40, 14d supply, fill #0

## 2023-02-21 NOTE — Progress Notes (Unsigned)
Office Visit Note  Patient: Joyce Watts             Date of Birth: September 10, 1960           MRN: [id number]             PCP: Noberto Retort, MD Referring: Noberto Retort, MD Visit Date: 02/25/2023   Subjective:  No chief complaint on file.   History of Present Illness: Joyce Watts is a 62 y.o. female here for follow up for Sjogren's syndrome and osteoarthritis.    Previous HPI 12/04/2022 Joyce Watts is a 62 y.o. female here for follow up for Sjogren's syndrome and osteoarthritis.  At her last visit we did right shoulder steroid injection she felt a good improvement in symptoms and has stayed better since that time.  Getting a little bit of pain with some full overhead movement but much better than before.  Still has dry mouth symptoms she is taking the pilocarpine at nighttime which is helpful.  No new ulcers or lesions no thrush.  She recently took course of Augmentin for suspected bacterial sinus infection though is still having drainage problems again afterwards.  Thinks this may be more allergy related she does have history of environmental allergies.  Also with facial rashes that are itchy sometimes flaky around her eyes and face she is treating this with cetaphil and taking Allegra for allergies.  She is also noticed this rash can be more pronounced when stressed out.  She is very stressed and getting sleep deprivation when looking after her mother who is seriously ill and end-of-life.  Had a few more episodes with bluish finger discoloration but no associated lesions or skin peeling.  Taking Tylenol as needed for hand osteoarthritis not every day.   Previous HPI 09/04/22 Joyce Watts is a 62 y.o. female here for follow up for sjogren syndrome and osteoarthritis. She had right knee replacement surgery in November this was uneventful and has been progressing well with rehab so far. Flexion ROM is still improving. She is no longer taking any NSAIDs regularly, takes tylenol  arthritis as needed which is partially helpful. Her left thumb still hurts sometimes but worst area is the right shoulder. Still having dryness symptoms uses drops occasionally, her mouth is a bigger problem sometimes with burning discomfort and tried adding a humidifier in the bedroom.   Previous HPI 04/29/22 Joyce Watts is a 62 y.o. female here for follow up for evaluation of joint pain in multiple areas especially with concern of increasing stiffness and intermittent swelling in the bilateral hands. She saw Dr. Ranell Patrick for follow up and planning to schedule right knee replacement once approved through insurance. Joint pains in hands remains worst around the base of the thumb area. She bought some OTC eye drops for dryness. Also notices some persistent eye swelling or irritation after styling eye lashes.   Previous HPI 04/12/2022 Joyce Watts is a 62 y.o. female here for evaluation of joint pain in multiple areas especially with concern of increasing stiffness and intermittent swelling in the bilateral hands.  She has longstanding history of knee osteoarthritis has had steroid and viscosupplementation injections with Dr. Ranell Patrick.  Also has some chronic joint pain and frequent popping in her shoulders and elbows and hands but this has worsened.  She had bilateral carpal tunnel release surgery last year due to progressive worsening of sensory changes and had a good relief of symptoms after these.  However in  about the past 6 months she has been experiencing worsening pain and stiffness in both hands also in the right shoulder and elbow.  She notices intermittent swelling or puffiness throughout the hands often lasting up to 1 or 2 hours daily.  She tried taking meloxicam for inflammation with no appreciable benefit although switching to twice daily naproxen she has seen partial improvement in symptoms when taking this.  She notices locking up sensation pretty frequently throughout the right arm and in  fingers of both hands.  The locking up of fingers is often most severe at night or first thing in the morning.  She had some laboratory testing for this with a elevated sedimentation rate. Besides her joint pains she does feel some persistent fatigue and gets short of breath more easily with activity than normal for her.  She has chronic dryness of eyes and mouth.  Does not take any specific medications for this.   No Rheumatology ROS completed.   PMFS History:  Patient Active Problem List   Diagnosis Date Noted   High risk medication use 04/29/2022   Sjogren's disease (HCC) 04/29/2022   Bilateral hand pain 04/12/2022   Pain in right shoulder 04/12/2022   Elevated sed rate 04/12/2022   Other malaise and fatigue 04/21/2014   Secondary hypothyroidism 04/21/2014   Left thyroid nodule 04/21/2014    Past Medical History:  Diagnosis Date   Anxiety    Asthma    Carpal tunnel syndrome    Depression    Hyperlipidemia    LDL in 6/15 = 133   Hypothyroidism    Dr. Lucianne Muss   Migraines    Past history    Family History  Problem Relation Age of Onset   Dementia Mother    High Cholesterol Mother    Hypertension Father    Diabetes Father    Bladder Cancer Father    Myasthenia gravis Father    Diabetes Maternal Grandfather    Cancer Paternal Grandmother    Diabetes Paternal Grandfather    Thyroid disease Neg Hx    Past Surgical History:  Procedure Laterality Date   CARPAL TUNNEL RELEASE Right 01/24/2021   CARPAL TUNNEL RELEASE Left 02/12/2021   KNEE ARTHROPLASTY     REPLACEMENT TOTAL KNEE Right    SPINAL CORD DECOMPRESSION  09/05/1992   C1   TONSILECTOMY/ADENOIDECTOMY WITH MYRINGOTOMY N/A 1993   Social History   Social History Narrative   Not on file   Immunization History  Administered Date(s) Administered   Influenza, Quadrivalent, Recombinant, Inj, Pf 06/10/2019, 06/12/2020   Influenza,inj,Quad PF,6+ Mos 06/16/2014, 05/24/2016   Influenza-Unspecified 09/19/2015    PFIZER(Purple Top)SARS-COV-2 Vaccination 11/19/2019, 12/15/2019, 08/28/2020     Objective: Vital Signs: There were no vitals taken for this visit.   Physical Exam   Musculoskeletal Exam: ***  CDAI Exam: CDAI Score: -- Patient Global: --; Provider Global: -- Swollen: --; Tender: -- Joint Exam 02/25/2023   No joint exam has been documented for this visit   There is currently no information documented on the homunculus. Go to the Rheumatology activity and complete the homunculus joint exam.  Investigation: No additional findings.  Imaging: No results found.  Recent Labs: Lab Results  Component Value Date   PROT 6.1 04/29/2022    Speciality Comments: No specialty comments available.  Procedures:  No procedures performed Allergies: Bacitracin, Neomycin, Bacitracin-polymyxin b, and Sulfa antibiotics   Assessment / Plan:     Visit Diagnoses: No diagnosis found.  ***  Orders: No orders  of the defined types were placed in this encounter.  No orders of the defined types were placed in this encounter.    Follow-Up Instructions: No follow-ups on file.   Ellen Henri, CMA  Note - This record has been created using [employer].  Chart creation errors have been sought, but may not always  have been located. Such creation errors do not reflect on  the standard of medical care.

## 2023-02-24 ENCOUNTER — Other Ambulatory Visit: Payer: Self-pay

## 2023-02-24 ENCOUNTER — Other Ambulatory Visit (HOSPITAL_COMMUNITY): Payer: Self-pay

## 2023-02-25 ENCOUNTER — Encounter: Payer: Self-pay | Admitting: Internal Medicine

## 2023-02-25 ENCOUNTER — Ambulatory Visit: Payer: 59 | Attending: Internal Medicine | Admitting: Internal Medicine

## 2023-02-25 VITALS — BP 123/76 | HR 84 | Resp 14 | Ht 62.0 in | Wt 173.0 lb

## 2023-02-25 DIAGNOSIS — R21 Rash and other nonspecific skin eruption: Secondary | ICD-10-CM

## 2023-02-25 DIAGNOSIS — M3509 Sicca syndrome with other organ involvement: Secondary | ICD-10-CM

## 2023-02-25 DIAGNOSIS — R5383 Other fatigue: Secondary | ICD-10-CM | POA: Insufficient documentation

## 2023-02-25 DIAGNOSIS — G5600 Carpal tunnel syndrome, unspecified upper limb: Secondary | ICD-10-CM | POA: Insufficient documentation

## 2023-02-25 DIAGNOSIS — R700 Elevated erythrocyte sedimentation rate: Secondary | ICD-10-CM

## 2023-02-25 DIAGNOSIS — M79641 Pain in right hand: Secondary | ICD-10-CM | POA: Diagnosis not present

## 2023-02-25 DIAGNOSIS — L309 Dermatitis, unspecified: Secondary | ICD-10-CM | POA: Insufficient documentation

## 2023-02-25 DIAGNOSIS — M25511 Pain in right shoulder: Secondary | ICD-10-CM | POA: Diagnosis not present

## 2023-02-25 DIAGNOSIS — G8929 Other chronic pain: Secondary | ICD-10-CM

## 2023-02-25 DIAGNOSIS — M79642 Pain in left hand: Secondary | ICD-10-CM

## 2023-02-25 LAB — SEDIMENTATION RATE: Sed Rate: 28 mm/h (ref 0–30)

## 2023-02-25 MED ORDER — TRIAMCINOLONE ACETONIDE 40 MG/ML IJ SUSP
20.0000 mg | INTRAMUSCULAR | Status: AC | PRN
Start: 2023-02-25 — End: 2023-02-25
  Administered 2023-02-25: 20 mg via INTRA_ARTICULAR

## 2023-02-25 MED ORDER — LIDOCAINE HCL 1 % IJ SOLN
0.5000 mL | INTRAMUSCULAR | Status: AC | PRN
Start: 2023-02-25 — End: 2023-02-25
  Administered 2023-02-25: .5 mL

## 2023-02-26 LAB — C-REACTIVE PROTEIN: CRP: 4.4 mg/L (ref <8.0)

## 2023-02-26 LAB — CK: Total CK: 315 U/L — ABNORMAL HIGH (ref 29–143)

## 2023-02-27 ENCOUNTER — Other Ambulatory Visit (HOSPITAL_COMMUNITY): Payer: Self-pay

## 2023-06-05 ENCOUNTER — Encounter: Payer: Self-pay | Admitting: Internal Medicine

## 2023-06-05 ENCOUNTER — Ambulatory Visit: Payer: 59 | Attending: Internal Medicine | Admitting: Internal Medicine

## 2023-06-05 VITALS — BP 115/73 | HR 88 | Resp 15 | Ht 62.0 in | Wt 163.0 lb

## 2023-06-05 DIAGNOSIS — M25511 Pain in right shoulder: Secondary | ICD-10-CM | POA: Diagnosis not present

## 2023-06-05 DIAGNOSIS — M3509 Sicca syndrome with other organ involvement: Secondary | ICD-10-CM | POA: Diagnosis not present

## 2023-06-05 DIAGNOSIS — G8929 Other chronic pain: Secondary | ICD-10-CM

## 2023-06-05 DIAGNOSIS — R21 Rash and other nonspecific skin eruption: Secondary | ICD-10-CM

## 2023-06-05 DIAGNOSIS — R748 Abnormal levels of other serum enzymes: Secondary | ICD-10-CM | POA: Insufficient documentation

## 2023-06-05 DIAGNOSIS — M79641 Pain in right hand: Secondary | ICD-10-CM

## 2023-06-05 DIAGNOSIS — M79642 Pain in left hand: Secondary | ICD-10-CM

## 2023-06-05 DIAGNOSIS — L309 Dermatitis, unspecified: Secondary | ICD-10-CM

## 2023-06-11 ENCOUNTER — Other Ambulatory Visit (HOSPITAL_COMMUNITY): Payer: Self-pay

## 2023-06-11 MED ORDER — MAXITROL 3.5-10000-0.1 OP OINT
TOPICAL_OINTMENT | OPHTHALMIC | 2 refills | Status: AC
  Filled 2023-06-11: qty 3.5, 7d supply, fill #0
  Filled 2023-09-23: qty 3.5, 7d supply, fill #1
  Filled 2024-03-13: qty 3.5, 7d supply, fill #2

## 2023-06-13 LAB — MYOSITIS SPECIFIC II ANTIBODIES PANEL
EJ AB: <11 SI (ref <11)
JO-1 AB: <11 SI (ref <11)
MDA-5 AB: <11 SI (ref <11)
MI-2 ALPHA AB: <11 SI (ref <11)
MI-2 BETA AB: <11 SI (ref <11)
NXP-2 AB: <11 SI (ref <11)
OJ AB: <11 SI (ref <11)
PL-12 AB: <11 SI (ref <11)
PL-7 AB: <11 SI (ref <11)
SRP-AB: <11 SI (ref <11)
TIF-1y AB: <11 SI (ref <11)

## 2023-06-13 LAB — PROTEIN ELECTROPHORESIS,RANDOM URN
Creatinine, Urine: 130 mg/dL (ref 20–275)
Protein/Creat Ratio: 115 mg/g{creat} (ref 24–184)
Protein/Creatinine Ratio: 0.115 mg/mg{creat} (ref 0.024–0.184)
Total Protein, Urine: 15 mg/dL (ref 5–24)

## 2023-06-13 LAB — CK: Total CK: 177 U/L — ABNORMAL HIGH (ref 29–143)

## 2023-07-07 ENCOUNTER — Other Ambulatory Visit: Payer: Self-pay

## 2023-07-07 ENCOUNTER — Other Ambulatory Visit (HOSPITAL_COMMUNITY): Payer: Self-pay

## 2023-07-08 ENCOUNTER — Other Ambulatory Visit: Payer: Self-pay

## 2023-07-08 ENCOUNTER — Other Ambulatory Visit (HOSPITAL_COMMUNITY): Payer: Self-pay

## 2023-07-08 MED ORDER — ALPRAZOLAM 0.5 MG PO TABS
0.5000 mg | ORAL_TABLET | ORAL | 0 refills | Status: DC
  Filled 2023-07-08: qty 40, 14d supply, fill #0

## 2023-07-09 ENCOUNTER — Other Ambulatory Visit (HOSPITAL_COMMUNITY): Payer: Self-pay

## 2023-07-09 ENCOUNTER — Other Ambulatory Visit: Payer: Self-pay

## 2023-07-10 ENCOUNTER — Other Ambulatory Visit: Payer: Self-pay

## 2023-07-10 ENCOUNTER — Other Ambulatory Visit (HOSPITAL_COMMUNITY): Payer: Self-pay

## 2023-07-10 ENCOUNTER — Other Ambulatory Visit: Payer: Self-pay | Admitting: Family Medicine

## 2023-07-10 ENCOUNTER — Telehealth: Payer: Self-pay

## 2023-07-10 ENCOUNTER — Ambulatory Visit
Admission: RE | Admit: 2023-07-10 | Discharge: 2023-07-10 | Disposition: A | Discharge status: Home / Self Care | Payer: 59 | Source: Ambulatory Visit | Attending: Family Medicine | Admitting: Family Medicine

## 2023-07-10 ENCOUNTER — Encounter: Payer: Self-pay | Admitting: Endocrinology

## 2023-07-10 DIAGNOSIS — R053 Chronic cough: Secondary | ICD-10-CM

## 2023-07-10 DIAGNOSIS — E038 Other specified hypothyroidism: Secondary | ICD-10-CM

## 2023-07-10 MED ORDER — AMPHETAMINE-DEXTROAMPHETAMINE 10 MG PO TABS
10.0000 mg | ORAL_TABLET | Freq: Two times a day (BID) | ORAL | 0 refills | Status: DC
  Filled 2023-07-10: qty 4, 2d supply, fill #0
  Filled 2023-07-10: qty 56, 28d supply, fill #0

## 2023-07-10 MED ORDER — LEVOTHYROXINE SODIUM 75 MCG PO TABS
75.0000 ug | ORAL_TABLET | Freq: Every day | ORAL | 3 refills | Status: DC
  Filled 2023-07-10 (×2): qty 90, 90d supply, fill #0
  Filled 2023-09-23: qty 90, 90d supply, fill #1

## 2023-07-10 MED ORDER — MUPIROCIN 2 % EX OINT
1.0000 | TOPICAL_OINTMENT | Freq: Three times a day (TID) | CUTANEOUS | 1 refills | Status: AC | PRN
  Filled 2023-07-10: qty 22, 5d supply, fill #0

## 2023-07-10 NOTE — Telephone Encounter (Signed)
Levothyroxine med refill request complete

## 2023-07-26 ENCOUNTER — Other Ambulatory Visit (HOSPITAL_COMMUNITY): Payer: Self-pay

## 2023-07-28 ENCOUNTER — Other Ambulatory Visit (HOSPITAL_COMMUNITY): Payer: Self-pay

## 2023-07-28 MED ORDER — BUPROPION HCL ER (XL) 300 MG PO TB24
300.0000 mg | ORAL_TABLET | Freq: Every day | ORAL | 1 refills | Status: DC
  Filled 2023-07-28: qty 90, 90d supply, fill #0
  Filled 2023-10-26: qty 90, 90d supply, fill #1

## 2023-08-06 ENCOUNTER — Other Ambulatory Visit: Payer: Self-pay | Admitting: Family Medicine

## 2023-08-06 DIAGNOSIS — M898X9 Other specified disorders of bone, unspecified site: Secondary | ICD-10-CM

## 2023-08-08 ENCOUNTER — Other Ambulatory Visit (HOSPITAL_COMMUNITY): Payer: Self-pay

## 2023-08-08 MED ORDER — MONTELUKAST SODIUM 10 MG PO TABS
10.0000 mg | ORAL_TABLET | Freq: Every day | ORAL | 5 refills | Status: DC
  Filled 2023-08-08: qty 30, 30d supply, fill #0
  Filled 2023-08-29 – 2023-09-02 (×2): qty 30, 30d supply, fill #1
  Filled 2023-09-23: qty 30, 30d supply, fill #2
  Filled 2023-10-26: qty 30, 30d supply, fill #3
  Filled 2023-11-29: qty 30, 30d supply, fill #4
  Filled 2023-12-31: qty 30, 30d supply, fill #5

## 2023-08-21 ENCOUNTER — Encounter: Payer: Self-pay | Admitting: Family Medicine

## 2023-08-24 ENCOUNTER — Other Ambulatory Visit: Payer: 59

## 2023-08-24 ENCOUNTER — Encounter: Payer: Self-pay | Admitting: Internal Medicine

## 2023-08-25 ENCOUNTER — Other Ambulatory Visit (HOSPITAL_COMMUNITY): Payer: Self-pay

## 2023-08-25 MED ORDER — NYSTATIN 100000 UNIT/ML MT SUSP
5.0000 mL | Freq: Four times a day (QID) | OROMUCOSAL | 0 refills | Status: DC | PRN
  Filled 2023-08-25: qty 240, 12d supply, fill #0

## 2023-08-25 NOTE — Telephone Encounter (Signed)
Patient was last prescribed magic mouthwash on 05/23/2022.   Last Fill: 09/31/2023  Next Visit: 12/05/2023  Last Visit: 06/05/2023  Dx: not mentioned  Current Dose per office note on 06/05/2023: not mentioned  Please review prescription.   Okay to refill Magic Mouthwash?

## 2023-08-25 NOTE — Telephone Encounter (Signed)
Attempted to contact the patient to advise the magic mouthwash was sent in. Left a message to call the office back.

## 2023-08-26 ENCOUNTER — Other Ambulatory Visit (HOSPITAL_COMMUNITY): Payer: Self-pay

## 2023-08-28 ENCOUNTER — Ambulatory Visit
Admission: RE | Admit: 2023-08-28 | Discharge: 2023-08-28 | Disposition: A | Discharge status: Home / Self Care | Payer: 59 | Source: Ambulatory Visit | Attending: Family Medicine | Admitting: Family Medicine

## 2023-08-28 DIAGNOSIS — M898X9 Other specified disorders of bone, unspecified site: Secondary | ICD-10-CM

## 2023-08-29 ENCOUNTER — Other Ambulatory Visit (HOSPITAL_COMMUNITY): Payer: Self-pay

## 2023-08-29 ENCOUNTER — Other Ambulatory Visit: Payer: Self-pay | Admitting: Internal Medicine

## 2023-08-29 DIAGNOSIS — M3509 Sicca syndrome with other organ involvement: Secondary | ICD-10-CM

## 2023-08-30 ENCOUNTER — Other Ambulatory Visit (HOSPITAL_COMMUNITY): Payer: Self-pay

## 2023-09-01 ENCOUNTER — Other Ambulatory Visit (HOSPITAL_COMMUNITY): Payer: Self-pay

## 2023-09-01 NOTE — Telephone Encounter (Signed)
Last Fill: 02/11/2023  Next Visit: 12/05/2023  Last Visit: 06/05/2023  Dx: Sjogren's syndrome with other organ involvement   Current Dose per office note on 06/05/2023: pilocarpine 5 mg twice daily as needed for dryness.   Okay to refill Pilocarpine?

## 2023-09-02 ENCOUNTER — Other Ambulatory Visit (HOSPITAL_BASED_OUTPATIENT_CLINIC_OR_DEPARTMENT_OTHER): Payer: Self-pay

## 2023-09-02 ENCOUNTER — Other Ambulatory Visit (HOSPITAL_COMMUNITY): Payer: Self-pay

## 2023-09-02 MED ORDER — HYDROCHLOROTHIAZIDE 25 MG PO TABS
25.0000 mg | ORAL_TABLET | Freq: Every morning | ORAL | 1 refills | Status: DC
  Filled 2023-09-02 – 2023-09-09 (×2): qty 90, 90d supply, fill #0
  Filled 2024-02-25: qty 30, 30d supply, fill #1
  Filled 2024-04-16: qty 30, 30d supply, fill #2
  Filled 2024-08-04: qty 30, 30d supply, fill #3

## 2023-09-05 MED ORDER — PILOCARPINE HCL 5 MG PO TABS
5.0000 mg | ORAL_TABLET | Freq: Two times a day (BID) | ORAL | 5 refills | Status: DC | PRN
Start: 2023-09-05 — End: 2024-04-16
  Filled 2023-09-05 – 2023-09-09 (×2): qty 60, 30d supply, fill #0
  Filled 2023-09-23 – 2023-10-26 (×2): qty 60, 30d supply, fill #1
  Filled 2023-11-29: qty 60, 30d supply, fill #2
  Filled 2023-12-31: qty 60, 30d supply, fill #3
  Filled 2024-02-25: qty 60, 30d supply, fill #4
  Filled 2024-03-20: qty 60, 30d supply, fill #5

## 2023-09-06 ENCOUNTER — Other Ambulatory Visit (HOSPITAL_COMMUNITY): Payer: Self-pay

## 2023-09-08 ENCOUNTER — Other Ambulatory Visit (HOSPITAL_COMMUNITY): Payer: Self-pay

## 2023-09-09 ENCOUNTER — Other Ambulatory Visit (HOSPITAL_COMMUNITY): Payer: Self-pay

## 2023-09-09 ENCOUNTER — Other Ambulatory Visit: Payer: Self-pay

## 2023-09-10 ENCOUNTER — Ambulatory Visit: Payer: 59 | Attending: Internal Medicine | Admitting: Internal Medicine

## 2023-09-10 VITALS — BP 153/84 | HR 90 | Resp 14

## 2023-09-10 DIAGNOSIS — M79642 Pain in left hand: Secondary | ICD-10-CM | POA: Diagnosis not present

## 2023-09-10 DIAGNOSIS — M159 Polyosteoarthritis, unspecified: Secondary | ICD-10-CM | POA: Diagnosis not present

## 2023-09-10 DIAGNOSIS — M79641 Pain in right hand: Secondary | ICD-10-CM

## 2023-09-10 DIAGNOSIS — M3509 Sicca syndrome with other organ involvement: Secondary | ICD-10-CM

## 2023-09-10 MED ORDER — LIDOCAINE HCL 1 % IJ SOLN
0.5000 mL | INTRAMUSCULAR | Status: AC | PRN
  Administered 2023-09-10: .5 mL

## 2023-09-10 MED ORDER — TRIAMCINOLONE ACETONIDE 40 MG/ML IJ SUSP
20.0000 mg | INTRAMUSCULAR | Status: AC | PRN
  Administered 2023-09-10: 20 mg via INTRA_ARTICULAR

## 2023-09-10 NOTE — Progress Notes (Deleted)
 Office Visit Note  Patient: Joyce Watts             Date of Birth: 23-Jul-1961           MRN: [id number]             PCP: Arloa Elsie SAUNDERS, MD Referring: Arloa Elsie SAUNDERS, MD Visit Date: 09/10/2023   Subjective:  No chief complaint on file.   History of Present Illness: Joyce Watts is a 63 y.o. female here for follow up ***   Previous HPI 06/05/23 Joyce Watts is a 63 y.o. female here for follow up for Sjogren's syndrome and osteoarthritis.  At her last visit we did a right wrist injection for severely increased pain and stiffness symptoms did improve although there was some waning benefit she feels is now down to about her baseline level of joint pain and stiffness.  Not having any persistent numbness.  She still has stiffness usually worst in the shoulders and the hips first thing in the morning also has pain at night that requires her to shift positions usually at least 2 or 3 times per night awakening.  Continues using pilocarpine  for dry mouth also Act dry mouth products are helpful. She keeps frequent bruising on her legs most commonly at the lower anterior shin on her right leg also has a much larger area of bruising since returning from a recent trip last month.   Previous HPI 02/25/2023 Joyce Watts is a 63 y.o. female here for follow up for Sjogren's syndrome and osteoarthritis. Increased pain and stiffness with mild swelling or nodule palpable. Worse since weeks ago, has gradually improved part ways. She resumed taking naproxen nightly for this. She also feels weakness or loss of precision with her hands. Pain and stiffness often worse at night and early morning. Also feels sore in the forearm muscles near elbow. This does not feel similar to previous carpal tunnel symptoms.   Previous HPI 12/04/2022 Joyce Watts is a 63 y.o. female here for follow up for Sjogren's syndrome and osteoarthritis.  At her last visit we did right shoulder steroid injection she felt a good  improvement in symptoms and has stayed better since that time.  Getting a little bit of pain with some full overhead movement but much better than before.  Still has dry mouth symptoms she is taking the pilocarpine  at nighttime which is helpful.  No new ulcers or lesions no thrush.  She recently took course of Augmentin  for suspected bacterial sinus infection though is still having drainage problems again afterwards.  Thinks this may be more allergy related she does have history of environmental allergies.  Also with facial rashes that are itchy sometimes flaky around her eyes and face she is treating this with cetaphil and taking Allegra for allergies.  She is also noticed this rash can be more pronounced when stressed out.  She is very stressed and getting sleep deprivation when looking after her mother who is seriously ill and end-of-life.  Had a few more episodes with bluish finger discoloration but no associated lesions or skin peeling.  Taking Tylenol  as needed for hand osteoarthritis not every day.   Previous HPI 09/04/22 Joyce Watts is a 63 y.o. female here for follow up for sjogren syndrome and osteoarthritis. She had right knee replacement surgery in November this was uneventful and has been progressing well with rehab so far. Flexion ROM is still improving. She is no longer taking any NSAIDs  regularly, takes tylenol  arthritis as needed which is partially helpful. Her left thumb still hurts sometimes but worst area is the right shoulder. Still having dryness symptoms uses drops occasionally, her mouth is a bigger problem sometimes with burning discomfort and tried adding a humidifier in the bedroom.   Previous HPI 04/29/22 Joyce Watts is a 63 y.o. female here for follow up for evaluation of joint pain in multiple areas especially with concern of increasing stiffness and intermittent swelling in the bilateral hands. She saw Dr. Kay for follow up and planning to schedule right knee replacement  once approved through insurance. Joint pains in hands remains worst around the base of the thumb area. She bought some OTC eye drops for dryness. Also notices some persistent eye swelling or irritation after styling eye lashes.   Previous HPI 04/12/2022 Joyce Watts is a 63 y.o. female here for evaluation of joint pain in multiple areas especially with concern of increasing stiffness and intermittent swelling in the bilateral hands.  She has longstanding history of knee osteoarthritis has had steroid and viscosupplementation injections with Dr. Kay.  Also has some chronic joint pain and frequent popping in her shoulders and elbows and hands but this has worsened.  She had bilateral carpal tunnel release surgery last year due to progressive worsening of sensory changes and had a good relief of symptoms after these.  However in about the past 6 months she has been experiencing worsening pain and stiffness in both hands also in the right shoulder and elbow.  She notices intermittent swelling or puffiness throughout the hands often lasting up to 1 or 2 hours daily.  She tried taking meloxicam  for inflammation with no appreciable benefit although switching to twice daily naproxen she has seen partial improvement in symptoms when taking this.  She notices locking up sensation pretty frequently throughout the right arm and in fingers of both hands.  The locking up of fingers is often most severe at night or first thing in the morning.  She had some laboratory testing for this with a elevated sedimentation rate. Besides her joint pains she does feel some persistent fatigue and gets short of breath more easily with activity than normal for her.  She has chronic dryness of eyes and mouth.  Does not take any specific medications for this.   No Rheumatology ROS completed.   PMFS History:  Patient Active Problem List   Diagnosis Date Noted   Elevated CK 06/05/2023   Carpal tunnel syndrome 02/25/2023    Dermatitis of face 02/25/2023   Fatigue 02/25/2023   High risk medication use 04/29/2022   Sjogren's disease (HCC) 04/29/2022   Bilateral hand pain 04/12/2022   Pain in right shoulder 04/12/2022   Elevated sed rate 04/12/2022   Other malaise and fatigue 04/21/2014   Secondary hypothyroidism 04/21/2014   Left thyroid  nodule 04/21/2014    Past Medical History:  Diagnosis Date   Anxiety    Asthma    Carpal tunnel syndrome    Depression    Hyperlipidemia    LDL in 6/15 = 133   Hypothyroidism    Dr. Von   Migraines    Past history    Family History  Problem Relation Age of Onset   Dementia Mother    High Cholesterol Mother    Hypertension Father    Diabetes Father    Bladder Cancer Father    Myasthenia gravis Father    Diabetes Maternal Grandfather    Cancer Paternal Grandmother  Diabetes Paternal Grandfather    Thyroid  disease Neg Hx    Past Surgical History:  Procedure Laterality Date   CARPAL TUNNEL RELEASE Right 01/24/2021   CARPAL TUNNEL RELEASE Left 02/12/2021   KNEE ARTHROPLASTY     REPLACEMENT TOTAL KNEE Right    SPINAL CORD DECOMPRESSION  09/05/1992   C1   TONSILECTOMY/ADENOIDECTOMY WITH MYRINGOTOMY N/A 1993   Social History   Social History Narrative   Not on file   Immunization History  Administered Date(s) Administered   Influenza, Quadrivalent, Recombinant, Inj, Pf 06/10/2019, 06/12/2020   Influenza,inj,Quad PF,6+ Mos 06/16/2014, 05/24/2016, 08/20/2017   Influenza-Unspecified 09/19/2015, 06/10/2019, 06/19/2021   PFIZER(Purple Top)SARS-COV-2 Vaccination 11/19/2019, 12/15/2019, 08/28/2020     Objective: Vital Signs: BP (!) 153/84 (BP Location: Left Arm, Patient Position: Sitting, Cuff Size: Normal)   Pulse 90   Resp 14    Physical Exam   Musculoskeletal Exam: ***  CDAI Exam: CDAI Score: -- Patient Global: --; Provider Global: -- Swollen: --; Tender: -- Joint Exam 09/10/2023   No joint exam has been documented for this visit    There is currently no information documented on the homunculus. Go to the Rheumatology activity and complete the homunculus joint exam.  Investigation: No additional findings.  Imaging: No results found.  Recent Labs: Lab Results  Component Value Date   PROT 6.1 04/29/2022    Speciality Comments: No specialty comments available.  Procedures:  No procedures performed Allergies: Bacitracin, Neomycin , Bacitracin-polymyxin b, and Sulfa antibiotics   Assessment / Plan:     Visit Diagnoses: No diagnosis found.  ***  Orders: No orders of the defined types were placed in this encounter.  No orders of the defined types were placed in this encounter.    Follow-Up Instructions: No follow-ups on file.   Lonni LELON Ester, MD  Note - This record has been created using [employer].  Chart creation errors have been sought, but may not always  have been located. Such creation errors do not reflect on  the standard of medical care.

## 2023-09-10 NOTE — Progress Notes (Signed)
   Procedure Note  Patient: Joyce Watts             Date of Birth: 1960/09/15           MRN: [id number]             Visit Date: 09/10/2023  Procedures: Visit Diagnoses:  1. Sjogren's syndrome with other organ involvement (HCC)   2. Bilateral hand pain   3. Generalized osteoarthritis of multiple sites     Medium Joint Inj: R radiocarpal on 09/10/2023 8:30 AM Indications: pain Details: 25 G 1.5 in needle, dorsal approach Medications: 0.5 mL lidocaine  1 %; 20 mg triamcinolone  acetonide 40 MG/ML Outcome: tolerated well, no immediate complications Procedure, treatment alternatives, risks and benefits explained, specific risks discussed. Consent was given by the patient. Immediately prior to procedure a time out was called to verify the correct patient, procedure, equipment, support staff and site/side marked as required. Patient was prepped and draped in the usual sterile fashion.     Increased right wrist pain particularly since last week, increased more this week. No visible change in swelling or redness. Pain is worst with lifting any weight or twisting movement with her hand. Last injection of both wrists in June with good benefit until now.

## 2023-09-12 ENCOUNTER — Other Ambulatory Visit (HOSPITAL_COMMUNITY): Payer: Self-pay

## 2023-09-23 ENCOUNTER — Other Ambulatory Visit: Payer: Self-pay

## 2023-09-23 ENCOUNTER — Other Ambulatory Visit (HOSPITAL_COMMUNITY): Payer: Self-pay

## 2023-09-24 ENCOUNTER — Other Ambulatory Visit (HOSPITAL_COMMUNITY): Payer: Self-pay

## 2023-10-09 ENCOUNTER — Other Ambulatory Visit (HOSPITAL_COMMUNITY): Payer: Self-pay

## 2023-10-10 ENCOUNTER — Other Ambulatory Visit (HOSPITAL_COMMUNITY): Payer: Self-pay

## 2023-10-10 MED ORDER — ALPRAZOLAM 0.5 MG PO TABS
0.5000 mg | ORAL_TABLET | Freq: Three times a day (TID) | ORAL | 0 refills | Status: DC | PRN
  Filled 2023-10-10: qty 40, 14d supply, fill #0

## 2023-10-20 ENCOUNTER — Other Ambulatory Visit (HOSPITAL_COMMUNITY): Payer: Self-pay

## 2023-10-21 ENCOUNTER — Other Ambulatory Visit (HOSPITAL_COMMUNITY): Payer: Self-pay

## 2023-10-24 ENCOUNTER — Other Ambulatory Visit: Payer: Self-pay

## 2023-10-24 DIAGNOSIS — E038 Other specified hypothyroidism: Secondary | ICD-10-CM

## 2023-10-27 ENCOUNTER — Other Ambulatory Visit (HOSPITAL_COMMUNITY): Payer: Self-pay

## 2023-10-28 ENCOUNTER — Encounter: Payer: Self-pay | Admitting: Endocrinology

## 2023-10-28 ENCOUNTER — Other Ambulatory Visit: Payer: 59

## 2023-10-28 DIAGNOSIS — E038 Other specified hypothyroidism: Secondary | ICD-10-CM | POA: Diagnosis not present

## 2023-10-28 LAB — T3, FREE: T3, Free: 3.8 pg/mL (ref 2.3–4.2)

## 2023-10-28 LAB — T4, FREE: Free T4: 1.4 ng/dL (ref 0.8–1.8)

## 2023-10-28 LAB — TSH: TSH: 0.02 m[IU]/L — ABNORMAL LOW (ref 0.40–4.50)

## 2023-10-29 DIAGNOSIS — M75122 Complete rotator cuff tear or rupture of left shoulder, not specified as traumatic: Secondary | ICD-10-CM | POA: Diagnosis not present

## 2023-10-29 DIAGNOSIS — M898X1 Other specified disorders of bone, shoulder: Secondary | ICD-10-CM | POA: Diagnosis not present

## 2023-10-30 DIAGNOSIS — M75120 Complete rotator cuff tear or rupture of unspecified shoulder, not specified as traumatic: Secondary | ICD-10-CM | POA: Insufficient documentation

## 2023-10-31 ENCOUNTER — Encounter: Payer: Self-pay | Admitting: Endocrinology

## 2023-10-31 ENCOUNTER — Other Ambulatory Visit (HOSPITAL_COMMUNITY): Payer: Self-pay

## 2023-10-31 ENCOUNTER — Ambulatory Visit (INDEPENDENT_AMBULATORY_CARE_PROVIDER_SITE_OTHER): Payer: 59 | Admitting: Endocrinology

## 2023-10-31 VITALS — BP 132/78 | HR 74 | Ht 62.0 in | Wt 162.0 lb

## 2023-10-31 DIAGNOSIS — E038 Other specified hypothyroidism: Secondary | ICD-10-CM | POA: Diagnosis not present

## 2023-10-31 DIAGNOSIS — E041 Nontoxic single thyroid nodule: Secondary | ICD-10-CM

## 2023-10-31 MED ORDER — LEVOTHYROXINE SODIUM 75 MCG PO TABS
75.0000 ug | ORAL_TABLET | Freq: Every day | ORAL | 3 refills | Status: AC
  Filled 2023-10-31: qty 90, 90d supply, fill #0
  Filled 2024-04-04: qty 30, 30d supply, fill #1
  Filled 2024-05-09: qty 30, 30d supply, fill #2
  Filled 2024-05-26 – 2024-06-08 (×2): qty 30, 30d supply, fill #3
  Filled 2024-07-06: qty 30, 30d supply, fill #4
  Filled 2024-08-04: qty 30, 30d supply, fill #5
  Filled 2024-09-04: qty 30, 30d supply, fill #6
  Filled 2024-10-05: qty 90, 90d supply, fill #7

## 2023-10-31 NOTE — Patient Instructions (Signed)
 US thyroid in 1 year prior to follow up visit.

## 2023-10-31 NOTE — Progress Notes (Signed)
 Outpatient Endocrinology Note [place] Jerett Odonohue, MD   Patient's Name: Joyce Watts    DOB: July 22, 1961    MRN: [id number]  REASON OF VISIT: Follow-up for hypothyroidism /thyroid nodule  PCP: Noberto Retort, MD  HISTORY OF PRESENT ILLNESS:   Joyce Watts is a 63 y.o. old female with past medical history as listed below is presented for a follow up for hypothyroidism /left thyroid nodule.   Pertinent Thyroid History: Patient was previously seen by Dr. Lucianne Muss and was last time seen in February 2024.  She was seen in evaluation for secondary hypothyroidism in 04/2014.  At that time she was having symptoms of fatigue, weight gain, memory difficulties, brittle nails and some hair loss; symptoms had started in 5/15. She was found to have a low free T4 of 0.59 by her PCP and referred here for further management. TSH previously was normal She was evaluated for pituitary hypofunction and all the labs were normal including appropriate level of  FSH. Initially after starting supplementation patient had improved her energy level as well as less issues with concentration and memory.    - In 12/19 she was concerned about her nail splitting, hair loss, cold intolerance and difficulty focusing mentally Subsequently in 3/21 she was complaining of more fatigue than usual which was nonspecific, she tends to have chronic coldness of her feet and some dry skin   - She was taking 112 mcg of levothyroxine daily and this was reduced 10 08/2020 when she was having more symptoms of anxiety and her free T4 was 1.43. The dose was further reduced in 8/22 down to 75 mcg from 88 because of occasional symptoms of nervousness and also high normal free T3 level of 4.2.  Lately she has been taking levothyroxine 75 mcg daily.   Weight is continuing to improve after her knee surgery. She also takes Adderall to help her focus mentally better. Free T4 is about the same and her free T3 is usually normal. Previously has had  suppressed TSH.  # Left thyroid nodule :  On her  initial exam in 2015 she was found to have a thyroid nodule on the left side and on ultrasound this was a solid nodule, measuring 4.8 cm. She will have occasional difficulty swallowing as before, she gets a feeling of food stopping the upper chest. This may be slightly better with taking Pepcid as recommended by PCP   02/2020 ultrasound report: Nodule within the left thyroid is essentially unchanged in size from the comparison ultrasound in 2015.   Thyroid biopsy showed benign follicular cells in 05/2014.   Interval history Patient has been taking levothyroxine 75 mcg daily, takes in the morning and empty stomach and reports compliance.  She denies palpitation, heat intolerance, change in bowel habit.  No increased anxiety.  Body weight has been relatively stable.  Recent lab with low TSH, normal free T4 and free T3.    Latest Reference Range & Units 10/28/23 08:12  TSH 0.40 - 4.50 mIU/L 0.02 (L)  Triiodothyronine,Free,Serum 2.3 - 4.2 pg/mL 3.8  T4,Free(Direct) 0.8 - 1.8 ng/dL 1.4  (L): Data is abnormally low  She has occasional neck discomfort, has been the same way for last few years, no change in neck symptoms.  No other complaints today.  REVIEW OF SYSTEMS:  As per history of present illness.   PAST MEDICAL HISTORY: Past Medical History:  Diagnosis Date   Anxiety    Asthma    Carpal tunnel syndrome  Depression    Hyperlipidemia    LDL in 6/15 = 133   Hypothyroidism    Dr. Lucianne Muss   Migraines    Past history    PAST SURGICAL HISTORY: Past Surgical History:  Procedure Laterality Date   CARPAL TUNNEL RELEASE Right 01/24/2021   CARPAL TUNNEL RELEASE Left 02/12/2021   KNEE ARTHROPLASTY     REPLACEMENT TOTAL KNEE Right    SPINAL CORD DECOMPRESSION  09/05/1992   C1   TONSILECTOMY/ADENOIDECTOMY WITH MYRINGOTOMY N/A 1993    ALLERGIES: Allergies  Allergen Reactions   Bacitracin Other (See Comments)   Neomycin Other  (See Comments)   Bacitracin-Polymyxin B Rash   Sulfa Antibiotics Rash    FAMILY HISTORY:  Family History  Problem Relation Age of Onset   Dementia Mother    High Cholesterol Mother    Hypertension Father    Diabetes Father    Bladder Cancer Father    Myasthenia gravis Father    Diabetes Maternal Grandfather    Cancer Paternal Grandmother    Diabetes Paternal Grandfather    Thyroid disease Neg Hx     SOCIAL HISTORY: Social History   Socioeconomic History   Marital status: Married    Spouse name: Not on file   Number of children: Not on file   Years of education: Not on file   Highest education level: Not on file  Occupational History   Not on file  Tobacco Use   Smoking status: Never    Passive exposure: Never   Smokeless tobacco: Never  Vaping Use   Vaping status: Never Used  Substance and Sexual Activity   Alcohol use: Not Currently   Drug use: Never   Sexual activity: Not on file  Other Topics Concern   Not on file  Social History Narrative   Not on file   Social Drivers of Health   Financial Resource Strain: Not on file  Food Insecurity: Not on file  Transportation Needs: Not on file  Physical Activity: Not on file  Stress: Not on file  Social Connections: Not on file    MEDICATIONS:  Current Outpatient Medications  Medication Sig Dispense Refill   albuterol (VENTOLIN HFA) 108 (90 Base) MCG/ACT inhaler Inhale 1 puff into the lungs every 4 hours as needed 6.7 g 1   ALPRAZolam (XANAX) 0.5 MG tablet Take 1 tablet (0.5 mg total) by mouth every 8-12 hours as needed. 40 tablet 0   ALPRAZolam (XANAX) 0.5 MG tablet Take 1 tablet (0.5 mg total) by mouth every 8-12 (eight-twelve) hours as needed. 40 tablet 0   amphetamine-dextroamphetamine (ADDERALL) 10 MG tablet Take 1 tablet (10 mg total) by mouth 2 (two) times daily. 60 tablet 0   amphetamine-dextroamphetamine (ADDERALL) 5 MG tablet Take 5 mg by mouth daily as needed. Take 1-2 tablets (5-10mg  total) by mouth  once daily.     buPROPion (WELLBUTRIN XL) 300 MG 24 hr tablet Take 1 tablet by mouth once daily 90 tablet 1   hydrochlorothiazide (HYDRODIURIL) 25 MG tablet Take 1 tablet (25 mg total) by mouth every morning as needed 90 tablet 1   magic mouthwash (nystatin, lidocaine, diphenhydrAMINE, alum & mag hydroxide) suspension Swish and spit 5 mLs 4 (four) times daily as needed for mouth pain. 240 mL 0   methocarbamol (ROBAXIN) 500 MG tablet Take 1 tablet (500 mg total) by mouth every 6 to 8 hours as needed for spasms for 10 days. 60 tablet 1   montelukast (SINGULAIR) 10 MG tablet Take 1  tablet (10 mg total) by mouth daily. 30 tablet 5   mupirocin ointment (BACTROBAN) 2 % Apply 1 application topically 3 times/day as needed-between meals & bedtime.     neomycin-polymyxin-dexameth (MAXITROL) 0.1 % OINT Apply 1 a thin layer on eyelid twice a day 3.5 g 2   pilocarpine (SALAGEN) 5 MG tablet Take 1 tablet (5 mg total) by mouth 2 (two) times daily as needed. 60 tablet 5   rizatriptan (MAXALT) 10 MG tablet Take 10 mg by mouth daily as needed for migraine. Take 1 tablet by mouth once daily as needed.     triamcinolone (KENALOG) 0.1 %      valACYclovir (VALTREX) 1000 MG tablet Take 2 tablets by mouth twice a day x 1 day per episode 20 tablet 3   levothyroxine (SYNTHROID) 75 MCG tablet Take 1 tablet (75 mcg total) by mouth daily. 90 tablet 3   No current facility-administered medications for this visit.    PHYSICAL EXAM: Vitals:   10/31/23 0816  BP: 132/78  Pulse: 74  SpO2: 97%  Weight: 162 lb (73.5 kg)  Height: 5\' 2"  (1.575 m)   Body mass index is 29.63 kg/m.  Wt Readings from Last 3 Encounters:  10/31/23 162 lb (73.5 kg)  06/05/23 163 lb (73.9 kg)  02/25/23 173 lb (78.5 kg)    General: Well developed, well nourished female in no apparent distress.  HEENT: AT/[place], no external lesions. Hearing intact to the spoken word Eyes: EOMI. No stare, proptosis or lid lag. Conjunctiva clear and no icterus. Neck:  Trachea midline, neck supple without appreciable thyromegaly or lymphadenopathy and left  palpable thyroid nodule + Abdomen: Soft, non tender, non distended Neurologic: Alert, oriented, normal speech, deep tendon biceps reflexes normal,  no gross focal neurological deficit Extremities: No pedal pitting edema, no tremors of outstretched hands Skin: Warm, color good.  Psychiatric: Does not appear depressed or anxious  PERTINENT HISTORIC LABORATORY AND IMAGING STUDIES:  All pertinent laboratory results were reviewed. Please see HPI also for further details.   TSH  Date Value Ref Range Status  10/28/2023 0.02 (L) 0.40 - 4.50 mIU/L Final  10/18/2022 0.31 (L) 0.35 - 5.50 uIU/mL Final  02/05/2022 0.01 (L) 0.35 - 5.50 uIU/mL Final     ASSESSMENT / PLAN  1. Secondary hypothyroidism   2. Left thyroid nodule    -Patient has hypothyroidism being managed as secondary hypothyroidism since 2015.  Baseline low free T4 level and symptoms of fatigue, weight gain, memory difficulties and  hair loss. She has had control of her symptoms with levothyroxine supplementation since about 2015.  Not clear about etiology of secondary hypothyroidism.  -She is currently taking levothyroxine 75 mcg daily.  She has no hyperthyroid symptoms.  Recent lab with low TSH and normal free T4 and free T3 appropriate for secondary hypothyroidism.  -She has no significant neck compressive symptoms.  She has stable occasional neck discomfort.  She has left thyroid nodule measuring 4.8 cm ultrasound in June 2021 stable in size, FNA of this nodule in 2015 with benign cytology.  Plan: -Continue current dose of levothyroxine 75 mcg daily. -Check ultrasound thyroid to monitor left thyroid nodule in 1 year prior to follow-up visit. -Annual endocrinology follow-up.   Joyce Watts was seen today for follow-up.  Diagnoses and all orders for this visit:  Secondary hypothyroidism -     T4, free -     TSH -     levothyroxine (SYNTHROID)  75 MCG tablet; Take 1 tablet (75 mcg  total) by mouth daily.  Left thyroid nodule -     US THYROID; Future    DISPOSITION Follow up in clinic in 12 months suggested.  All questions answered and patient verbalized understanding of the plan.  [place] Latunya Kissick, MD [facility] [address] [place], [address] [place], [place] [address] Phone # [phone]  At least part of this note was generated using voice recognition software. Inadvertent word errors may have occurred, which were not recognized during the proofreading process.

## 2023-11-03 DIAGNOSIS — M25512 Pain in left shoulder: Secondary | ICD-10-CM | POA: Diagnosis not present

## 2023-11-17 DIAGNOSIS — R229 Localized swelling, mass and lump, unspecified: Secondary | ICD-10-CM | POA: Diagnosis not present

## 2023-11-17 DIAGNOSIS — M20099 Other deformity of finger(s), unspecified finger(s): Secondary | ICD-10-CM | POA: Diagnosis not present

## 2023-11-21 NOTE — Progress Notes (Deleted)
 Office Visit Note  Patient: Joyce Watts             Date of Birth: 1961/04/09           MRN: [id number]             PCP: Noberto Retort, MD Referring: Noberto Retort, MD Visit Date: 12/05/2023   Subjective:  No chief complaint on file.   History of Present Illness: Joyce Watts is a 63 y.o. female here for follow up ***   Previous HPI    No Rheumatology ROS completed.   PMFS History:  Patient Active Problem List   Diagnosis Date Noted   Generalized osteoarthritis of multiple sites 09/10/2023   Elevated CK 06/05/2023   Carpal tunnel syndrome 02/25/2023   Dermatitis of face 02/25/2023   Fatigue 02/25/2023   High risk medication use 04/29/2022   Sjogren's disease (HCC) 04/29/2022   Bilateral hand pain 04/12/2022   Pain in right shoulder 04/12/2022   Elevated sed rate 04/12/2022   Other malaise and fatigue 04/21/2014   Secondary hypothyroidism 04/21/2014   Left thyroid nodule 04/21/2014    Past Medical History:  Diagnosis Date   Anxiety    Asthma    Carpal tunnel syndrome    Depression    Hyperlipidemia    LDL in 6/15 = 133   Hypothyroidism    Dr. Lucianne Muss   Migraines    Past history    Family History  Problem Relation Age of Onset   Dementia Mother    High Cholesterol Mother    Hypertension Father    Diabetes Father    Bladder Cancer Father    Myasthenia gravis Father    Diabetes Maternal Grandfather    Cancer Paternal Grandmother    Diabetes Paternal Grandfather    Thyroid disease Neg Hx    Past Surgical History:  Procedure Laterality Date   CARPAL TUNNEL RELEASE Right 01/24/2021   CARPAL TUNNEL RELEASE Left 02/12/2021   KNEE ARTHROPLASTY     REPLACEMENT TOTAL KNEE Right    SPINAL CORD DECOMPRESSION  09/05/1992   C1   TONSILECTOMY/ADENOIDECTOMY WITH MYRINGOTOMY N/A 1993   Social History   Social History Narrative   Not on file   Immunization History  Administered Date(s) Administered   Influenza, Quadrivalent, Recombinant, Inj,  Pf 06/10/2019, 06/12/2020   Influenza,inj,Quad PF,6+ Mos 06/16/2014, 05/24/2016, 08/20/2017   Influenza-Unspecified 09/19/2015, 06/10/2019, 06/19/2021   PFIZER(Purple Top)SARS-COV-2 Vaccination 11/19/2019, 12/15/2019, 08/28/2020     Objective: Vital Signs: There were no vitals taken for this visit.   Physical Exam   Musculoskeletal Exam: ***  CDAI Exam: CDAI Score: -- Patient Global: --; Provider Global: -- Swollen: --; Tender: -- Joint Exam 12/05/2023   No joint exam has been documented for this visit   There is currently no information documented on the homunculus. Go to the Rheumatology activity and complete the homunculus joint exam.  Investigation: No additional findings.  Imaging: No results found.  Recent Labs: Lab Results  Component Value Date   PROT 6.1 04/29/2022    Speciality Comments: No specialty comments available.  Procedures:  No procedures performed Allergies: Bacitracin, Neomycin, Bacitracin-polymyxin b, and Sulfa antibiotics   Assessment / Plan:     Visit Diagnoses: No diagnosis found.  ***  Orders: No orders of the defined types were placed in this encounter.  No orders of the defined types were placed in this encounter.    Follow-Up Instructions: No follow-ups on file.   Hansel Starling  P Dellamae Rosamilia, RT  Note - This record has been created using [employer].  Chart creation errors have been sought, but may not always  have been located. Such creation errors do not reflect on  the standard of medical care.

## 2023-11-25 DIAGNOSIS — M75122 Complete rotator cuff tear or rupture of left shoulder, not specified as traumatic: Secondary | ICD-10-CM | POA: Diagnosis not present

## 2023-11-25 DIAGNOSIS — M79641 Pain in right hand: Secondary | ICD-10-CM | POA: Diagnosis not present

## 2023-11-29 ENCOUNTER — Other Ambulatory Visit (HOSPITAL_COMMUNITY): Payer: Self-pay

## 2023-12-01 ENCOUNTER — Other Ambulatory Visit (HOSPITAL_COMMUNITY): Payer: Self-pay

## 2023-12-01 DIAGNOSIS — M79641 Pain in right hand: Secondary | ICD-10-CM | POA: Diagnosis not present

## 2023-12-01 MED ORDER — ALBUTEROL SULFATE HFA 108 (90 BASE) MCG/ACT IN AERS
1.0000 | INHALATION_SPRAY | RESPIRATORY_TRACT | 1 refills | Status: AC | PRN
Start: 2023-12-01 — End: ?
  Filled 2023-12-01: qty 6.7, 33d supply, fill #0
  Filled 2024-06-08: qty 6.7, 33d supply, fill #1

## 2023-12-04 DIAGNOSIS — M79641 Pain in right hand: Secondary | ICD-10-CM | POA: Diagnosis not present

## 2023-12-04 NOTE — Progress Notes (Unsigned)
 Office Visit Note  Patient: Joyce Watts             Date of Birth: 1960/11/24           MRN: [id number]             PCP: Noberto Retort, MD Referring: Noberto Retort, MD Visit Date: 12/05/2023   Subjective:  No chief complaint on file.   History of Present Illness: Joyce Watts is a 63 y.o. female here for follow up ***   Previous HPI 06/05/23 Joyce Watts is a 63 y.o. female here for follow up for Sjogren's syndrome and osteoarthritis.  At her last visit we did a right wrist injection for severely increased pain and stiffness symptoms did improve although there was some waning benefit she feels is now down to about her baseline level of joint pain and stiffness.  Not having any persistent numbness.  She still has stiffness usually worst in the shoulders and the hips first thing in the morning also has pain at night that requires her to shift positions usually at least 2 or 3 times per night awakening.  Continues using pilocarpine for dry mouth also Act dry mouth products are helpful. She keeps frequent bruising on her legs most commonly at the lower anterior shin on her right leg also has a much larger area of bruising since returning from a recent trip last month.   Previous HPI 02/25/2023 Joyce Watts is a 63 y.o. female here for follow up for Sjogren's syndrome and osteoarthritis. Increased pain and stiffness with mild swelling or nodule palpable. Worse since weeks ago, has gradually improved part ways. She resumed taking naproxen nightly for this. She also feels weakness or loss of precision with her hands. Pain and stiffness often worse at night and early morning. Also feels sore in the forearm muscles near elbow. This does not feel similar to previous carpal tunnel symptoms.   Previous HPI 12/04/2022 Joyce Watts is a 63 y.o. female here for follow up for Sjogren's syndrome and osteoarthritis.  At her last visit we did right shoulder steroid injection she felt a good  improvement in symptoms and has stayed better since that time.  Getting a little bit of pain with some full overhead movement but much better than before.  Still has dry mouth symptoms she is taking the pilocarpine at nighttime which is helpful.  No new ulcers or lesions no thrush.  She recently took course of Augmentin for suspected bacterial sinus infection though is still having drainage problems again afterwards.  Thinks this may be more allergy related she does have history of environmental allergies.  Also with facial rashes that are itchy sometimes flaky around her eyes and face she is treating this with cetaphil and taking Allegra for allergies.  She is also noticed this rash can be more pronounced when stressed out.  She is very stressed and getting sleep deprivation when looking after her mother who is seriously ill and end-of-life.  Had a few more episodes with bluish finger discoloration but no associated lesions or skin peeling.  Taking Tylenol as needed for hand osteoarthritis not every day.   Previous HPI 09/04/22 Joyce Watts is a 63 y.o. female here for follow up for sjogren syndrome and osteoarthritis. She had right knee replacement surgery in November this was uneventful and has been progressing well with rehab so far. Flexion ROM is still improving. She is no longer taking any NSAIDs  regularly, takes tylenol arthritis as needed which is partially helpful. Her left thumb still hurts sometimes but worst area is the right shoulder. Still having dryness symptoms uses drops occasionally, her mouth is a bigger problem sometimes with burning discomfort and tried adding a humidifier in the bedroom.   Previous HPI 04/29/22 KAORU Watts is a 63 y.o. female here for follow up for evaluation of joint pain in multiple areas especially with concern of increasing stiffness and intermittent swelling in the bilateral hands. She saw Dr. Ranell Patrick for follow up and planning to schedule right knee replacement  once approved through insurance. Joint pains in hands remains worst around the base of the thumb area. She bought some OTC eye drops for dryness. Also notices some persistent eye swelling or irritation after styling eye lashes.   Previous HPI 04/12/2022 Joyce Watts is a 63 y.o. female here for evaluation of joint pain in multiple areas especially with concern of increasing stiffness and intermittent swelling in the bilateral hands.  She has longstanding history of knee osteoarthritis has had steroid and viscosupplementation injections with Dr. Ranell Patrick.  Also has some chronic joint pain and frequent popping in her shoulders and elbows and hands but this has worsened.  She had bilateral carpal tunnel release surgery last year due to progressive worsening of sensory changes and had a good relief of symptoms after these.  However in about the past 6 months she has been experiencing worsening pain and stiffness in both hands also in the right shoulder and elbow.  She notices intermittent swelling or puffiness throughout the hands often lasting up to 1 or 2 hours daily.  She tried taking meloxicam for inflammation with no appreciable benefit although switching to twice daily naproxen she has seen partial improvement in symptoms when taking this.  She notices locking up sensation pretty frequently throughout the right arm and in fingers of both hands.  The locking up of fingers is often most severe at night or first thing in the morning.  She had some laboratory testing for this with a elevated sedimentation rate. Besides her joint pains she does feel some persistent fatigue and gets short of breath more easily with activity than normal for her.  She has chronic dryness of eyes and mouth.  Does not take any specific medications for this.   No Rheumatology ROS completed.   PMFS History:  Patient Active Problem List   Diagnosis Date Noted   Generalized osteoarthritis of multiple sites 09/10/2023   Elevated CK  06/05/2023   Carpal tunnel syndrome 02/25/2023   Dermatitis of face 02/25/2023   Fatigue 02/25/2023   High risk medication use 04/29/2022   Sjogren's disease (HCC) 04/29/2022   Bilateral hand pain 04/12/2022   Pain in right shoulder 04/12/2022   Elevated sed rate 04/12/2022   Other malaise and fatigue 04/21/2014   Secondary hypothyroidism 04/21/2014   Left thyroid nodule 04/21/2014    Past Medical History:  Diagnosis Date   Anxiety    Asthma    Carpal tunnel syndrome    Depression    Hyperlipidemia    LDL in 6/15 = 133   Hypothyroidism    Dr. Lucianne Muss   Migraines    Past history    Family History  Problem Relation Age of Onset   Dementia Mother    High Cholesterol Mother    Hypertension Father    Diabetes Father    Bladder Cancer Father    Myasthenia gravis Father    Diabetes  Maternal Grandfather    Cancer Paternal Grandmother    Diabetes Paternal Grandfather    Thyroid disease Neg Hx    Past Surgical History:  Procedure Laterality Date   CARPAL TUNNEL RELEASE Right 01/24/2021   CARPAL TUNNEL RELEASE Left 02/12/2021   KNEE ARTHROPLASTY     REPLACEMENT TOTAL KNEE Right    SPINAL CORD DECOMPRESSION  09/05/1992   C1   TONSILECTOMY/ADENOIDECTOMY WITH MYRINGOTOMY N/A 1993   Social History   Social History Narrative   Not on file   Immunization History  Administered Date(s) Administered   Influenza, Quadrivalent, Recombinant, Inj, Pf 06/10/2019, 06/12/2020   Influenza,inj,Quad PF,6+ Mos 06/16/2014, 05/24/2016, 08/20/2017   Influenza-Unspecified 09/19/2015, 06/10/2019, 06/19/2021   PFIZER(Purple Top)SARS-COV-2 Vaccination 11/19/2019, 12/15/2019, 08/28/2020     Objective: Vital Signs: There were no vitals taken for this visit.   Physical Exam   Musculoskeletal Exam: ***  CDAI Exam: CDAI Score: -- Patient Global: --; Provider Global: -- Swollen: --; Tender: -- Joint Exam 12/05/2023   No joint exam has been documented for this visit   There is currently  no information documented on the homunculus. Go to the Rheumatology activity and complete the homunculus joint exam.  Investigation: No additional findings.  Imaging: No results found.  Recent Labs: Lab Results  Component Value Date   PROT 6.1 04/29/2022    Speciality Comments: No specialty comments available.  Procedures:  No procedures performed Allergies: Bacitracin, Neomycin, Bacitracin-polymyxin b, and Sulfa antibiotics   Assessment / Plan:     Visit Diagnoses: No diagnosis found.  ***  Orders: No orders of the defined types were placed in this encounter.  No orders of the defined types were placed in this encounter.    Follow-Up Instructions: No follow-ups on file.   Fuller Plan, MD  Note - This record has been created using [employer].  Chart creation errors have been sought, but may not always  have been located. Such creation errors do not reflect on  the standard of medical care.

## 2023-12-05 ENCOUNTER — Ambulatory Visit: Payer: 59 | Attending: Internal Medicine | Admitting: Internal Medicine

## 2023-12-05 ENCOUNTER — Encounter: Payer: Self-pay | Admitting: Internal Medicine

## 2023-12-05 ENCOUNTER — Other Ambulatory Visit (HOSPITAL_COMMUNITY): Payer: Self-pay

## 2023-12-05 VITALS — BP 138/84 | HR 82 | Resp 14 | Ht 62.0 in | Wt 161.0 lb

## 2023-12-05 DIAGNOSIS — M79641 Pain in right hand: Secondary | ICD-10-CM

## 2023-12-05 DIAGNOSIS — M3509 Sicca syndrome with other organ involvement: Secondary | ICD-10-CM

## 2023-12-05 DIAGNOSIS — Z79899 Other long term (current) drug therapy: Secondary | ICD-10-CM | POA: Diagnosis not present

## 2023-12-05 DIAGNOSIS — S66811D Strain of other specified muscles, fascia and tendons at wrist and hand level, right hand, subsequent encounter: Secondary | ICD-10-CM

## 2023-12-05 DIAGNOSIS — M069 Rheumatoid arthritis, unspecified: Secondary | ICD-10-CM | POA: Diagnosis not present

## 2023-12-05 DIAGNOSIS — M79642 Pain in left hand: Secondary | ICD-10-CM | POA: Diagnosis not present

## 2023-12-05 MED ORDER — PREDNISONE 5 MG PO TABS
5.0000 mg | ORAL_TABLET | Freq: Every day | ORAL | 1 refills | Status: DC
  Filled 2023-12-05: qty 60, 30d supply, fill #0

## 2023-12-05 NOTE — Patient Instructions (Signed)
 Methotrexate Tablets What is this medication? METHOTREXATE (METH oh TREX ate) treats autoimmune conditions, such as arthritis and psoriasis. It works by decreasing inflammation, which can reduce pain and prevent long-term injury to the joints and skin. It may also be used to treat some types of cancer. It works by slowing down the growth of cancer cells. This medicine may be used for other purposes; ask your health care provider or pharmacist if you have questions. COMMON BRAND NAME(S): Rheumatrex, Trexall What should I tell my care team before I take this medication? They need to know if you have any of these conditions: Dehydration Diabetes Fluid in the stomach area or lungs Frequently drink alcohol Having surgery, including dental surgery High cholesterol Immune system problems Inflammatory bowel disease, such as ulcerative colitis Kidney disease Liver disease Low blood cell levels (white cells, red cells, and platelets) Lung disease Recent or ongoing radiation Recent or upcoming vaccine Stomach ulcers, other stomach or intestine problems An unusual or allergic reaction to methotrexate, other medications, foods, dyes, or preservatives Pregnant or trying to get pregnant Breastfeeding How should I use this medication? Take this medication by mouth with water. Take it as directed on the prescription label. Do not take extra. Keep taking this medication until your care team tells you to stop. Know why you are taking this medication and how you should take it. To treat conditions such as arthritis and psoriasis, this medication is taken ONCE A WEEK as a single dose or divided into 3 smaller doses taken 12 hours apart (do not take more than 3 doses 12 hours apart each week). This medication is NEVER taken daily to treat conditions other than cancer. Taking this medication more often than directed can cause serious side effects, even death. Talk to your care team about why you are taking this  medication, how often you will take it, and what your dose is. Ask your care team to put the reason you take this medication on the prescription. If you take this medication ONCE A WEEK, choose a day of the week before you start. Ask your pharmacist to include the day of the week on the label. Avoid "Monday", which could be misread as "Morning". Handling this medication may be harmful. Talk to your care team about how to handle this medication. Special instructions may apply. Talk to your care team about the use of this medication in children. While it may be prescribed for selected conditions, precautions do apply. Overdosage: If you think you have taken too much of this medicine contact a poison control center or emergency room at once. NOTE: This medicine is only for you. Do not share this medicine with others. What if I miss a dose? If you miss a dose, talk with your care team. Do not take double or extra doses. What may interact with this medication? Do not take this medication with any of the following: Acitretin Live virus vaccines Probenecid This medication may also interact with the following: Alcohol Aspirin and aspirin-like medications Certain antibiotics, such as penicillin, neomycin, sulfamethoxazole; trimethoprim Certain medications for stomach problems, such as lansoprazole, omeprazole, pantoprazole Clozapine Cyclosporine Dapsone Folic acid Foscarnet NSAIDs, medications for pain and inflammation, such as ibuprofen or naproxen Phenytoin Pyrimethamine Steroid medications, such as prednisone or cortisone Tacrolimus Theophylline This list may not describe all possible interactions. Give your health care provider a list of all the medicines, herbs, non-prescription drugs, or dietary supplements you use. Also tell them if you smoke, drink alcohol, or use  illegal drugs. Some items may interact with your medicine. What should I watch for while using this medication? Visit your  care team for regular checks on your progress. It may be some time before you see the benefit from this medication. You may need blood work done while you are taking this medication. If your care team has also prescribed folic acid, they may instruct you to skip your folic acid dose on the day you take methotrexate. This medication can make you more sensitive to the sun. Keep out of the sun. If you cannot avoid being in the sun, wear protective clothing and sunscreen. Do not use sun lamps, tanning beds, or tanning booths. Check with your care team if you have severe diarrhea, nausea, and vomiting, or if you sweat a lot. The loss of too much body fluid may make it dangerous for you to take this medication. This medication may increase your risk of getting an infection. Call your care team for advice if you get a fever, chills, sore throat, or other symptoms of a cold or flu. Do not treat yourself. Try to avoid being around people who are sick. Talk to your care team about your risk of cancer. You may be more at risk for certain types of cancers if you take this medication. Talk to your care team if you or your partner may be pregnant. Serious birth defects can occur if you take this medication during pregnancy and for 6 months after the last dose. You will need a negative pregnancy test before starting this medication. Contraception is recommended while taking this medication and for 6 months after the last dose. Your care team can help you find the option that works for you. If your partner can get pregnant, use a condom during sex while taking this medication and for 3 months after the last dose. Do not breastfeed while taking this medication and for 1 week after the last dose. This medication may cause infertility. Talk to your care team if you are concerned about your fertility. What side effects may I notice from receiving this medication? Side effects that you should report to your care team as soon  as possible: Allergic reactions--skin rash, itching, hives, swelling of the face, lips, tongue, or throat Dry cough, shortness of breath or trouble breathing Infection--fever, chills, cough, sore throat, wounds that don't heal, pain or trouble when passing urine, general feeling of discomfort or being unwell Kidney injury--decrease in the amount of urine, swelling of the ankles, hands, or feet Liver injury--right upper belly pain, loss of appetite, nausea, light-colored stool, dark yellow or brown urine, yellowing skin or eyes, unusual weakness or fatigue Low red blood cell level--unusual weakness or fatigue, dizziness, headache, trouble breathing Pain, tingling, or numbness in the hands or feet, muscle weakness, change in vision, confusion or trouble speaking, loss of balance or coordination, trouble walking, seizures Redness, blistering, peeling, or loosening of the skin, including inside the mouth Stomach bleeding--bloody or black, tar-like stools, vomiting blood or brown material that looks like coffee grounds Stomach pain that is severe, does not go away, or gets worse Unusual bruising or bleeding Side effects that usually do not require medical attention (report these to your care team if they continue or are bothersome): Diarrhea Dizziness Hair loss Nausea Pain, redness, or swelling with sores inside the mouth or throat Skin reactions on sun-exposed areas Vomiting This list may not describe all possible side effects. Call your doctor for medical advice about side effects.  You may report side effects to FDA at 1-800-FDA-1088. Where should I keep my medication? Keep out of the reach of children and pets. Store at room temperature between 20 and 25 degrees C (68 and 77 degrees F). Protect from light. Keep the container tightly closed. Get rid of any unused medication after the expiration date. To get rid of medications that are no longer needed or have expired: Take the medication to a  medication take-back program. Check with your pharmacy or law enforcement to find a location. If you cannot return the medication, ask your pharmacist or care team how to get rid of this medication safely. NOTE: This sheet is a summary. It may not cover all possible information. If you have questions about this medicine, talk to your doctor, pharmacist, or health care provider.  2024 Elsevier/Gold Standard (2023-08-01 00:00:00)

## 2023-12-06 ENCOUNTER — Other Ambulatory Visit (HOSPITAL_COMMUNITY): Payer: Self-pay

## 2023-12-08 ENCOUNTER — Other Ambulatory Visit: Payer: Self-pay

## 2023-12-08 ENCOUNTER — Encounter (HOSPITAL_BASED_OUTPATIENT_CLINIC_OR_DEPARTMENT_OTHER): Payer: Self-pay | Admitting: Orthopedic Surgery

## 2023-12-08 LAB — RNP ANTIBODY: Ribonucleic Protein(ENA) Antibody, IgG: >8 AI — AB

## 2023-12-08 LAB — RHEUMATOID FACTOR: Rheumatoid fact SerPl-aCnc: <10 [IU]/mL (ref <14)

## 2023-12-08 LAB — COMPREHENSIVE METABOLIC PANEL WITH GFR
AG Ratio: 1.6 (calc) (ref 1.0–2.5)
ALT: 10 U/L (ref 6–29)
AST: 20 U/L (ref 10–35)
Albumin: 4 g/dL (ref 3.6–5.1)
Alkaline phosphatase (APISO): 80 U/L (ref 37–153)
BUN: 17 mg/dL (ref 7–25)
CO2: 30 mmol/L (ref 20–32)
Calcium: 9.4 mg/dL (ref 8.6–10.4)
Chloride: 105 mmol/L (ref 98–110)
Creat: 0.75 mg/dL (ref 0.50–1.05)
Globulin: 2.5 g/dL (ref 1.9–3.7)
Glucose, Bld: 72 mg/dL (ref 65–99)
Potassium: 3.9 mmol/L (ref 3.5–5.3)
Sodium: 143 mmol/L (ref 135–146)
Total Bilirubin: 0.4 mg/dL (ref 0.2–1.2)
Total Protein: 6.5 g/dL (ref 6.1–8.1)
eGFR: 91 mL/min/{1.73_m2} (ref >=60)

## 2023-12-08 LAB — CBC WITH DIFFERENTIAL/PLATELET
Absolute Lymphocytes: 1725 {cells}/uL (ref 850–3900)
Absolute Monocytes: 608 {cells}/uL (ref 200–950)
Basophils Absolute: 49 {cells}/uL (ref 0–200)
Basophils Relative: 0.6 %
Eosinophils Absolute: 284 {cells}/uL (ref 15–500)
Eosinophils Relative: 3.5 %
HCT: 40.3 % (ref 35.0–45.0)
Hemoglobin: 12.3 g/dL (ref 11.7–15.5)
MCH: 26.8 pg — ABNORMAL LOW (ref 27.0–33.0)
MCHC: 30.5 g/dL — ABNORMAL LOW (ref 32.0–36.0)
MCV: 87.8 fL (ref 80.0–100.0)
MPV: 10.2 fL (ref 7.5–12.5)
Monocytes Relative: 7.5 %
Neutro Abs: 5435 {cells}/uL (ref 1500–7800)
Neutrophils Relative %: 67.1 %
Platelets: 308 10*3/uL (ref 140–400)
RBC: 4.59 10*6/uL (ref 3.80–5.10)
RDW: 14 % (ref 11.0–15.0)
Total Lymphocyte: 21.3 %
WBC: 8.1 10*3/uL (ref 3.8–10.8)

## 2023-12-08 LAB — CK: Total CK: 127 U/L (ref 20–243)

## 2023-12-08 LAB — HEPATITIS B CORE ANTIBODY, IGM: Hep B C IgM: NONREACTIVE

## 2023-12-08 LAB — CYCLIC CITRUL PEPTIDE ANTIBODY, IGG: Cyclic Citrullin Peptide Ab: <16 U

## 2023-12-08 LAB — HEPATITIS B SURFACE ANTIGEN: Hepatitis B Surface Ag: NONREACTIVE

## 2023-12-08 LAB — C3 AND C4
C3 Complement: 163 mg/dL (ref 83–193)
C4 Complement: 32 mg/dL (ref 15–57)

## 2023-12-08 LAB — HEPATITIS C ANTIBODY: Hepatitis C Ab: NONREACTIVE

## 2023-12-08 LAB — SEDIMENTATION RATE: Sed Rate: 28 mm/h (ref 0–30)

## 2023-12-08 LAB — C-REACTIVE PROTEIN: CRP: 6.7 mg/L (ref <8.0)

## 2023-12-09 ENCOUNTER — Encounter (HOSPITAL_BASED_OUTPATIENT_CLINIC_OR_DEPARTMENT_OTHER): Payer: Self-pay | Admitting: Anesthesiology

## 2023-12-09 NOTE — H&P (Signed)
 Joyce Watts is an 63 y.o. female.   Chief Complaint: right wrist pain and inability to extend fingers small and ring HPI: pt here for surgery today Pt concerned about inability to extend small and ring fingers Pt has been followed in office   Past Medical History:  Diagnosis Date   Anxiety    Asthma    Carpal tunnel syndrome    Depression    Hyperlipidemia    LDL in 6/15 = 133   Hypothyroidism    Dr. Lucianne Muss   Migraines    Past history    Past Surgical History:  Procedure Laterality Date   CARPAL TUNNEL RELEASE Right 01/24/2021   CARPAL TUNNEL RELEASE Left 02/12/2021   KNEE ARTHROPLASTY     REPLACEMENT TOTAL KNEE Right    SPINAL CORD DECOMPRESSION  09/05/1992   C1   TONSILECTOMY/ADENOIDECTOMY WITH MYRINGOTOMY N/A 1993    Family History  Problem Relation Age of Onset   Dementia Mother    High Cholesterol Mother    Hypertension Father    Diabetes Father    Bladder Cancer Father    Myasthenia gravis Father    Diabetes Maternal Grandfather    Cancer Paternal Grandmother    Diabetes Paternal Grandfather    Thyroid disease Neg Hx    Social History:  reports that she has never smoked. She has never been exposed to tobacco smoke. She has never used smokeless tobacco. She reports that she does not currently use alcohol. She reports that she does not use drugs.  Allergies:  Allergies  Allergen Reactions   Bacitracin Other (See Comments)   Neomycin Other (See Comments)   Bacitracin-Polymyxin B Rash   Sulfa Antibiotics Rash    No medications prior to admission.    No results found for this or any previous visit (from the past 48 hours). No results found.  ROS NO RECENT ILLNESSES OR HOSPITALIZATIONS  Height 5\' 2"  (1.575 m), weight 73 kg. Physical Exam  General Appearance:  Alert, cooperative, no distress, appears stated age  Head:  Normocephalic, without obvious abnormality, atraumatic  Eyes:  Pupils equal, conjunctiva/corneas clear,         Throat: Lips,  mucosa, and tongue normal; teeth and gums normal  Neck: No visible masses     Lungs:   respirations unlabored  Chest Wall:  No tenderness or deformity  Heart:  Regular rate and rhythm,  Abdomen:   Soft, non-tender,         Extremities: INABILITY TO EXTEND SMALL AND RING FINGERS, INDEPENDENT EIP FUNCTION, FINGERS WARM WELL PERFUSED +SWELLING OVER 4TH DORSAL COMPARTMENT  Pulses: 2+ and symmetric  Skin: Skin color, texture, turgor normal, no rashes or lesions     Neurologic: Normal     Assessment/Plan RIGHT WRIST TENOSYNOVITIS AND SMALL AND RING FINGER TENDON RUPTURES  RIGHT WRIST TENOSYNOVECTOMY AND TENDON TRANSFER AS INDICATED  R/B/A DISCUSSED WITH PT IN OFFICE.  PT VOICED UNDERSTANDING OF PLAN CONSENT SIGNED DAY OF SURGERY PT SEEN AND EXAMINED PRIOR TO OPERATIVE PROCEDURE/DAY OF SURGERY SITE MARKED. QUESTIONS ANSWERED WILL GO HOME FOLLOWING SURGERY   WE ARE PLANNING SURGERY FOR YOUR UPPER EXTREMITY. THE RISKS AND BENEFITS OF SURGERY INCLUDE BUT NOT LIMITED TO BLEEDING INFECTION, DAMAGE TO NEARBY NERVES ARTERIES TENDONS, FAILURE OF SURGERY TO ACCOMPLISH ITS INTENDED GOALS, PERSISTENT SYMPTOMS AND NEED FOR FURTHER SURGICAL INTERVENTION. WITH THIS IN MIND WE WILL PROCEED. I HAVE DISCUSSED WITH THE PATIENT THE PRE AND POSTOPERATIVE REGIMEN AND THE DOS AND DON'TS. PT VOICED UNDERSTANDING  AND INFORMED CONSENT SIGNED.   Joyce Watts 12/09/2023, 9:11 PM

## 2023-12-10 ENCOUNTER — Encounter (HOSPITAL_COMMUNITY)
Admission: RE | Disposition: A | Discharge status: Home / Self Care | Payer: Self-pay | Source: Home / Self Care | Attending: Orthopedic Surgery

## 2023-12-10 ENCOUNTER — Ambulatory Visit (HOSPITAL_BASED_OUTPATIENT_CLINIC_OR_DEPARTMENT_OTHER): Admitting: Anesthesiology

## 2023-12-10 ENCOUNTER — Encounter (HOSPITAL_BASED_OUTPATIENT_CLINIC_OR_DEPARTMENT_OTHER): Payer: Self-pay | Admitting: Orthopedic Surgery

## 2023-12-10 ENCOUNTER — Encounter (HOSPITAL_BASED_OUTPATIENT_CLINIC_OR_DEPARTMENT_OTHER): Payer: Self-pay | Admitting: Anesthesiology

## 2023-12-10 ENCOUNTER — Other Ambulatory Visit: Payer: Self-pay

## 2023-12-10 ENCOUNTER — Ambulatory Visit (HOSPITAL_COMMUNITY): Admitting: Anesthesiology

## 2023-12-10 ENCOUNTER — Encounter (HOSPITAL_BASED_OUTPATIENT_CLINIC_OR_DEPARTMENT_OTHER)
Admission: RE | Disposition: A | Discharge status: Home / Self Care | Payer: Self-pay | Source: Home / Self Care | Attending: Orthopedic Surgery

## 2023-12-10 ENCOUNTER — Ambulatory Visit (HOSPITAL_BASED_OUTPATIENT_CLINIC_OR_DEPARTMENT_OTHER)
Admission: RE | Admit: 2023-12-10 | Discharge: 2023-12-10 | Disposition: A | Discharge status: Home / Self Care | Attending: Orthopedic Surgery | Admitting: Orthopedic Surgery

## 2023-12-10 DIAGNOSIS — S66394A Other injury of extensor muscle, fascia and tendon of right ring finger at wrist and hand level, initial encounter: Secondary | ICD-10-CM

## 2023-12-10 DIAGNOSIS — M65931 Unspecified synovitis and tenosynovitis, right forearm: Secondary | ICD-10-CM | POA: Insufficient documentation

## 2023-12-10 DIAGNOSIS — M79641 Pain in right hand: Secondary | ICD-10-CM | POA: Diagnosis present

## 2023-12-10 HISTORY — PX: TENDON TRANSFER: SHX6109

## 2023-12-10 HISTORY — PX: TENOSYNOVECTOMY: SHX6110

## 2023-12-10 SURGERY — TRANSFER, TENDON
Anesthesia: Regional | Laterality: Right

## 2023-12-10 SURGERY — TRANSFER, TENDON
Anesthesia: General | Site: Arm Lower | Laterality: Right

## 2023-12-10 MED ORDER — OXYCODONE HCL 5 MG PO TABS
5.0000 mg | ORAL_TABLET | Freq: Once | ORAL | Status: AC | PRN
  Administered 2023-12-10: 5 mg via ORAL

## 2023-12-10 MED ORDER — DEXAMETHASONE SODIUM PHOSPHATE 10 MG/ML IJ SOLN
INTRAMUSCULAR | Status: DC | PRN
  Administered 2023-12-10: 8 mg via INTRAVENOUS

## 2023-12-10 MED ORDER — CEFAZOLIN SODIUM-DEXTROSE 2-4 GM/100ML-% IV SOLN
INTRAVENOUS | Status: AC
  Filled 2023-12-10: qty 100

## 2023-12-10 MED ORDER — ONDANSETRON HCL 4 MG/2ML IJ SOLN
INTRAMUSCULAR | Status: AC
  Filled 2023-12-10: qty 2

## 2023-12-10 MED ORDER — PROPOFOL 10 MG/ML IV BOLUS
INTRAVENOUS | Status: DC | PRN
  Administered 2023-12-10: 50 mg via INTRAVENOUS
  Administered 2023-12-10: 150 mg via INTRAVENOUS

## 2023-12-10 MED ORDER — ONDANSETRON HCL 4 MG/2ML IJ SOLN
INTRAMUSCULAR | Status: DC | PRN
  Administered 2023-12-10: 4 mg via INTRAVENOUS

## 2023-12-10 MED ORDER — LIDOCAINE 2% (20 MG/ML) 5 ML SYRINGE
INTRAMUSCULAR | Status: AC
  Filled 2023-12-10: qty 5

## 2023-12-10 MED ORDER — OXYCODONE HCL 5 MG PO TABS
ORAL_TABLET | ORAL | Status: AC
  Filled 2023-12-10: qty 1

## 2023-12-10 MED ORDER — CEFAZOLIN SODIUM-DEXTROSE 2-4 GM/100ML-% IV SOLN
2.0000 g | INTRAVENOUS | Status: AC
  Administered 2023-12-10: 2 g via INTRAVENOUS

## 2023-12-10 MED ORDER — MIDAZOLAM HCL 2 MG/2ML IJ SOLN
INTRAMUSCULAR | Status: DC | PRN
  Administered 2023-12-10: 2 mg via INTRAVENOUS

## 2023-12-10 MED ORDER — FENTANYL CITRATE (PF) 250 MCG/5ML IJ SOLN
INTRAMUSCULAR | Status: AC
  Filled 2023-12-10: qty 5

## 2023-12-10 MED ORDER — ACETAMINOPHEN 500 MG PO TABS
ORAL_TABLET | ORAL | Status: AC
  Filled 2023-12-10: qty 2

## 2023-12-10 MED ORDER — FENTANYL CITRATE (PF) 100 MCG/2ML IJ SOLN
INTRAMUSCULAR | Status: AC
  Filled 2023-12-10: qty 2

## 2023-12-10 MED ORDER — FENTANYL CITRATE (PF) 250 MCG/5ML IJ SOLN
INTRAMUSCULAR | Status: DC | PRN
  Administered 2023-12-10: 50 ug via INTRAVENOUS
  Administered 2023-12-10 (×3): 25 ug via INTRAVENOUS
  Administered 2023-12-10: 50 ug via INTRAVENOUS
  Administered 2023-12-10 (×3): 25 ug via INTRAVENOUS

## 2023-12-10 MED ORDER — MIDAZOLAM HCL 2 MG/2ML IJ SOLN
INTRAMUSCULAR | Status: AC
Start: 2023-12-10 — End: ?
  Filled 2023-12-10: qty 2

## 2023-12-10 MED ORDER — POVIDONE-IODINE 7.5 % EX SOLN
Freq: Once | CUTANEOUS | Status: AC
  Filled 2023-12-10: qty 118

## 2023-12-10 MED ORDER — DEXAMETHASONE SODIUM PHOSPHATE 10 MG/ML IJ SOLN
INTRAMUSCULAR | Status: AC
  Filled 2023-12-10: qty 1

## 2023-12-10 MED ORDER — FENTANYL CITRATE (PF) 100 MCG/2ML IJ SOLN
25.0000 ug | INTRAMUSCULAR | Status: DC | PRN
  Administered 2023-12-10: 25 ug via INTRAVENOUS

## 2023-12-10 MED ORDER — OXYCODONE HCL 5 MG/5ML PO SOLN: 5.0000 mg | Freq: Once | ORAL | Status: AC | PRN

## 2023-12-10 MED ORDER — ONDANSETRON HCL 4 MG/2ML IJ SOLN: 4.0000 mg | Freq: Four times a day (QID) | INTRAMUSCULAR | Status: DC | PRN

## 2023-12-10 MED ORDER — LIDOCAINE 2% (20 MG/ML) 5 ML SYRINGE
INTRAMUSCULAR | Status: DC | PRN
  Administered 2023-12-10: 60 mg via INTRAVENOUS

## 2023-12-10 MED ORDER — CEFAZOLIN SODIUM-DEXTROSE 2-4 GM/100ML-% IV SOLN
2.0000 g | INTRAVENOUS | Status: DC
  Filled 2023-12-10: qty 100

## 2023-12-10 MED ORDER — LACTATED RINGERS IV SOLN
INTRAVENOUS | Status: DC
Start: 2023-12-10 — End: 2023-12-11

## 2023-12-10 MED ORDER — 0.9 % SODIUM CHLORIDE (POUR BTL) OPTIME
TOPICAL | Status: DC | PRN
  Administered 2023-12-10: 1000 mL

## 2023-12-10 MED ORDER — ACETAMINOPHEN 500 MG PO TABS
1000.0000 mg | ORAL_TABLET | Freq: Once | ORAL | Status: AC
  Administered 2023-12-10: 1000 mg via ORAL

## 2023-12-10 MED ORDER — PROPOFOL 10 MG/ML IV BOLUS
INTRAVENOUS | Status: AC
  Filled 2023-12-10: qty 20

## 2023-12-10 MED ORDER — MIDAZOLAM HCL 2 MG/2ML IJ SOLN
INTRAMUSCULAR | Status: AC
  Filled 2023-12-10: qty 2

## 2023-12-10 SURGICAL SUPPLY — 55 items
BAG COUNTER SPONGE SURGICOUNT (BAG) ×1 IMPLANT
BNDG COHESIVE 1X5 TAN STRL LF (GAUZE/BANDAGES/DRESSINGS) IMPLANT
BNDG ELASTIC 3INX 5YD STR LF (GAUZE/BANDAGES/DRESSINGS) IMPLANT
BNDG ELASTIC 4INX 5YD STR LF (GAUZE/BANDAGES/DRESSINGS) IMPLANT
BNDG ELASTIC 4X5.8 VLCR STR LF (GAUZE/BANDAGES/DRESSINGS) IMPLANT
BNDG ESMARK 4X9 LF (GAUZE/BANDAGES/DRESSINGS) ×1 IMPLANT
BNDG GAUZE DERMACEA FLUFF 4 (GAUZE/BANDAGES/DRESSINGS) IMPLANT
CORD BIPOLAR FORCEPS 12FT (ELECTRODE) ×1 IMPLANT
COVER SURGICAL LIGHT HANDLE (MISCELLANEOUS) ×1 IMPLANT
CUFF TOURN SGL QUICK 18X4 (TOURNIQUET CUFF) ×1 IMPLANT
CUFF TRNQT CYL 24X4X16.5-23 (TOURNIQUET CUFF) IMPLANT
DRAPE SURG 17X23 STRL (DRAPES) ×1 IMPLANT
DRSG ADAPTIC 3X8 NADH LF (GAUZE/BANDAGES/DRESSINGS) IMPLANT
GAUZE SPONGE 2X2 8PLY STRL LF (GAUZE/BANDAGES/DRESSINGS) IMPLANT
GAUZE SPONGE 4X4 12PLY STRL (GAUZE/BANDAGES/DRESSINGS) IMPLANT
GAUZE XEROFORM 1X8 LF (GAUZE/BANDAGES/DRESSINGS) ×1 IMPLANT
GLOVE BIOGEL PI IND STRL 8.5 (GLOVE) ×1 IMPLANT
GLOVE SURG ORTHO 8.0 STRL STRW (GLOVE) ×1 IMPLANT
GOWN STRL REUS W/ TWL LRG LVL3 (GOWN DISPOSABLE) ×2 IMPLANT
GOWN STRL REUS W/ TWL XL LVL3 (GOWN DISPOSABLE) ×1 IMPLANT
KIT BASIN OR (CUSTOM PROCEDURE TRAY) ×1 IMPLANT
KIT TURNOVER KIT B (KITS) ×1 IMPLANT
MANIFOLD NEPTUNE II (INSTRUMENTS) ×1 IMPLANT
NDL HYPO 25GX1X1/2 BEV (NEEDLE) IMPLANT
NEEDLE HYPO 25GX1X1/2 BEV (NEEDLE) IMPLANT
NS IRRIG 1000ML POUR BTL (IV SOLUTION) ×1 IMPLANT
PACK ORTHO EXTREMITY (CUSTOM PROCEDURE TRAY) ×1 IMPLANT
PAD ARMBOARD POSITIONER FOAM (MISCELLANEOUS) ×2 IMPLANT
PAD CAST 4YDX4 CTTN HI CHSV (CAST SUPPLIES) IMPLANT
PADDING CAST ABS COTTON 3X4 (CAST SUPPLIES) IMPLANT
SET CYSTO W/LG BORE CLAMP LF (SET/KITS/TRAYS/PACK) IMPLANT
SOAP 2 % CHG 4 OZ (WOUND CARE) ×1 IMPLANT
SPECIMEN JAR SMALL (MISCELLANEOUS) ×1 IMPLANT
SUCTION TUBE FRAZIER 10FR DISP (SUCTIONS) IMPLANT
SUT ETHIBOND 4 0 TF (SUTURE) IMPLANT
SUT ETHILON 8 0 BV130 4 (SUTURE) IMPLANT
SUT ETHILON 9 0 BV130 4 (SUTURE) IMPLANT
SUT FIBER WIRE 4.0 (SUTURE) IMPLANT
SUT FIBERWIRE 2-0 18 17.9 3/8 (SUTURE) IMPLANT
SUT FIBERWIRE 3-0 18 TAPR NDL (SUTURE) IMPLANT
SUT MERSILENE 4 0 P 3 (SUTURE) IMPLANT
SUT MNCRL AB 4-0 PS2 18 (SUTURE) IMPLANT
SUT PROLENE 4 0 PS 2 18 (SUTURE) IMPLANT
SUT PROLENE 5 0 PS 2 (SUTURE) IMPLANT
SUT PROLENE 6 0 P 1 18 (SUTURE) IMPLANT
SUT VIC AB 2-0 CT1 TAPERPNT 27 (SUTURE) IMPLANT
SUTURE FIBERWR 2-0 18 17.9 3/8 (SUTURE) IMPLANT
SUTURE FIBERWR 3-0 18 TAPR NDL (SUTURE) IMPLANT
SYR CONTROL 10ML LL (SYRINGE) IMPLANT
TOWEL GREEN STERILE (TOWEL DISPOSABLE) ×1 IMPLANT
TOWEL GREEN STERILE FF (TOWEL DISPOSABLE) ×1 IMPLANT
TUBE CONNECTING 12X1/4 (SUCTIONS) IMPLANT
TUBE NG 5FR 35IN ENFIT (TUBING) IMPLANT
UNDERPAD 30X36 HEAVY ABSORB (UNDERPADS AND DIAPERS) ×1 IMPLANT
WATER STERILE IRR 1000ML POUR (IV SOLUTION) ×1 IMPLANT

## 2023-12-10 SURGICAL SUPPLY — 47 items
BENZOIN TINCTURE PRP APPL 2/3 (GAUZE/BANDAGES/DRESSINGS) IMPLANT
BLADE SURG 15 STRL LF DISP TIS (BLADE) ×1 IMPLANT
BNDG ELASTIC 3INX 5YD STR LF (GAUZE/BANDAGES/DRESSINGS) IMPLANT
BNDG ELASTIC 4INX 5YD STR LF (GAUZE/BANDAGES/DRESSINGS) IMPLANT
BNDG ESMARK 4X9 LF (GAUZE/BANDAGES/DRESSINGS) ×1 IMPLANT
BNDG GAUZE DERMACEA FLUFF 4 (GAUZE/BANDAGES/DRESSINGS) ×1 IMPLANT
CORD BIPOLAR FORCEPS 12FT (ELECTRODE) ×1 IMPLANT
COVER BACK TABLE 60X90IN (DRAPES) ×1 IMPLANT
COVER MAYO STAND STRL (DRAPES) ×1 IMPLANT
CUFF TOURN SGL QUICK 18X4 (TOURNIQUET CUFF) IMPLANT
DRAPE EXTREMITY T 121X128X90 (DISPOSABLE) ×1 IMPLANT
DRAPE SURG 17X23 STRL (DRAPES) ×1 IMPLANT
DRSG EMULSION OIL 3X3 NADH (GAUZE/BANDAGES/DRESSINGS) IMPLANT
GAUZE SPONGE 4X4 12PLY STRL (GAUZE/BANDAGES/DRESSINGS) ×1 IMPLANT
GLOVE BIO SURGEON STRL SZ 6.5 (GLOVE) ×1 IMPLANT
GLOVE BIOGEL PI IND STRL 6.5 (GLOVE) ×1 IMPLANT
GLOVE BIOGEL PI IND STRL 8.5 (GLOVE) ×1 IMPLANT
GLOVE SURG ORTHO 8.0 STRL STRW (GLOVE) ×1 IMPLANT
GOWN STRL REUS W/ TWL LRG LVL3 (GOWN DISPOSABLE) ×1 IMPLANT
GOWN STRL REUS W/ TWL XL LVL3 (GOWN DISPOSABLE) ×1 IMPLANT
LOOP VESSEL MINI RED (MISCELLANEOUS) IMPLANT
LOOPS VASCLR MAXI BLUE 18IN ST (MISCELLANEOUS) IMPLANT
NEEDLE HYPO 25X1 1.5 SAFETY (NEEDLE) IMPLANT
NS IRRIG 1000ML POUR BTL (IV SOLUTION) ×1 IMPLANT
PACK BASIN DAY SURGERY FS (CUSTOM PROCEDURE TRAY) ×1 IMPLANT
PAD CAST 4YDX4 CTTN HI CHSV (CAST SUPPLIES) IMPLANT
PADDING CAST ABS COTTON 4X4 ST (CAST SUPPLIES) ×1 IMPLANT
SLEEVE SCD COMPRESS KNEE MED (STOCKING) ×1 IMPLANT
SPLINT FIBERGLASS 3X35 (CAST SUPPLIES) IMPLANT
SPLINT FIBERGLASS 4X30 (CAST SUPPLIES) IMPLANT
STOCKINETTE 4X48 STRL (DRAPES) ×1 IMPLANT
STRIP CLOSURE SKIN 1/2X4 (GAUZE/BANDAGES/DRESSINGS) IMPLANT
SUCTION TUBE FRAZIER 10FR DISP (SUCTIONS) IMPLANT
SUT ETHIBOND 3-0 V-5 (SUTURE) IMPLANT
SUT ETHILON 4 0 PS 2 18 (SUTURE) IMPLANT
SUT MERSILENE 4 0 P 3 (SUTURE) IMPLANT
SUT MNCRL AB 4-0 PS2 18 (SUTURE) IMPLANT
SUT PROLENE 4 0 PS 2 18 (SUTURE) ×1 IMPLANT
SUT VIC AB 2-0 SH 27XBRD (SUTURE) IMPLANT
SUT VIC AB 3-0 FS2 27 (SUTURE) IMPLANT
SUT VIC AB 4-0 PS2 18 (SUTURE) IMPLANT
SYR BULB EAR ULCER 3OZ GRN STR (SYRINGE) ×1 IMPLANT
SYR CONTROL 10ML LL (SYRINGE) IMPLANT
TIE VASCULAR MAXI BLUE 18IN ST (MISCELLANEOUS) IMPLANT
TRAY DSU PREP LF (CUSTOM PROCEDURE TRAY) ×1 IMPLANT
TUBE CONNECTING 20X1/4 (TUBING) IMPLANT
UNDERPAD 30X36 HEAVY ABSORB (UNDERPADS AND DIAPERS) ×1 IMPLANT

## 2023-12-10 NOTE — Anesthesia Procedure Notes (Signed)
 Procedure Name: LMA Insertion Date/Time: 12/10/2023 5:17 PM  Performed by: Darlina Guys, CRNAPre-anesthesia Checklist: Patient identified, Emergency Drugs available, Suction available and Patient being monitored Patient Re-evaluated:Patient Re-evaluated prior to induction Oxygen Delivery Method: Circle System Utilized Preoxygenation: Pre-oxygenation with 100% oxygen Induction Type: IV induction Ventilation: Mask ventilation without difficulty LMA: LMA inserted LMA Size: 4.0 Number of attempts: 1 Placement Confirmation: positive ETCO2 Tube secured with: Tape Dental Injury: Teeth and Oropharynx as per pre-operative assessment  Comments: Atraumatic induction/LMA insertion. Dentition and oral mucosa as per preop.

## 2023-12-10 NOTE — Anesthesia Postprocedure Evaluation (Signed)
 Anesthesia Post Note  Patient: AMANDEEP HOGSTON  Procedure(s) Performed: TRANSFER, TENDON (Right: Arm Lower) TENOSYNOVECTOMY (Right: Arm Lower)     Patient location during evaluation: PACU Anesthesia Type: General Level of consciousness: awake and alert Pain management: pain level controlled Vital Signs Assessment: post-procedure vital signs reviewed and stable Respiratory status: spontaneous breathing, nonlabored ventilation and respiratory function stable Cardiovascular status: blood pressure returned to baseline and stable Postop Assessment: no apparent nausea or vomiting Anesthetic complications: no   No notable events documented.  Last Vitals:  Vitals:   12/10/23 1915 12/10/23 1930  BP: (!) 141/63 (!) 131/98  Pulse: 77 74  Resp: 12 (!) 9  Temp:  36.6 C  SpO2: 93% 96%    Last Pain:  Vitals:   12/10/23 1915  TempSrc:   PainSc: 3                  Tacuma Graffam

## 2023-12-10 NOTE — Discharge Instructions (Signed)
 KEEP BANDAGE CLEAN AND DRY CALL OFFICE FOR F/U APPT [phone] in 14 days Rx sent to [facility] KEEP HAND ELEVATED ABOVE HEART OK TO APPLY ICE TO OPERATIVE AREA CONTACT OFFICE IF ANY WORSENING PAIN OR CONCERNS.

## 2023-12-10 NOTE — Progress Notes (Signed)
 Patient transferred here from [facility] due to case time delay and facility closing. Pre-op complete at surgery center. New consent obtained for main campus. IV flushed and running well. Patient and family made aware that surgery would begin as soon as Careers adviser finished case at surgery center and arrives to main campus.

## 2023-12-10 NOTE — Anesthesia Preprocedure Evaluation (Signed)
 Anesthesia Evaluation  Patient identified by MRN, date of birth, ID band Patient awake    Reviewed: Allergy & Precautions, H&P , NPO status , Patient's Chart, lab work & pertinent test results  Airway Mallampati: II   Neck ROM: full    Dental   Pulmonary asthma    breath sounds clear to auscultation       Cardiovascular negative cardio ROS  Rhythm:regular Rate:Normal     Neuro/Psych  Headaches PSYCHIATRIC DISORDERS Anxiety Depression     Neuromuscular disease    GI/Hepatic   Endo/Other  Hypothyroidism    Renal/GU      Musculoskeletal  (+) Arthritis ,    Abdominal   Peds  Hematology   Anesthesia Other Findings   Reproductive/Obstetrics                             Anesthesia Physical Anesthesia Plan  ASA: 2  Anesthesia Plan: General   Post-op Pain Management:    Induction: Intravenous  PONV Risk Score and Plan: 3 and Ondansetron, Dexamethasone, Midazolam and Treatment may vary due to age or medical condition  Airway Management Planned: LMA  Additional Equipment:   Intra-op Plan:   Post-operative Plan: Extubation in OR  Informed Consent: I have reviewed the patients History and Physical, chart, labs and discussed the procedure including the risks, benefits and alternatives for the proposed anesthesia with the patient or authorized representative who has indicated his/her understanding and acceptance.     Dental advisory given  Plan Discussed with: CRNA, Anesthesiologist and Surgeon  Anesthesia Plan Comments:        Anesthesia Quick Evaluation

## 2023-12-10 NOTE — Op Note (Signed)
 PREOPERATIVE DIAGNOSIS: Right wrist extensor tenosynovitis  right small finger extensor tendon rupture Right ring finger extensor tendon rupture  POSTOPERATIVE DIAGNOSIS: Same  ATTENDING SURGEON: Dr. Bradly Bienenstock who scrubbed and present for the entire procedure  ASSISTANT SURGEON: None  ANESTHESIA: General  OPERATIVE PROCEDURE: Right wrist extensor tenosynovectomy, 45409 Right ring finger to long finger extensor tendon transfer dorsum of hand, 81191 Right extensor indices proprius to the right small finger EDC, 25310  IMPLANTS: None  EBL: Minimal  RADIOGRAPHIC INTERPRETATION: None  SURGICAL INDICATIONS: Patient is a right-hand-dominant female with Sjogren's and concerning for rheumatoid arthritis with the extensor tendon ruptures and inflammatory changes over the fourth dorsal compartment.  Patient was seen and evaluated in the office.  Patient was recommended undergo the above procedure.  Risks of surgery include but not limited to bleeding infection damage nearby nerves arteries or tendons loss of motion of the wrist and digits incomplete relief of symptoms and need for further surgical invention.  SURGICAL TECHNIQUE: The patient was properly identified in the preoperative holding area marked the prone a marker made on the right hand indicate the correct operative site.  The patient then brought back the operating placed supine on the incision table where the general anesthetic was administered.  Patient tolerated this well.  A well-padded tourniquet was then placed on the right brachium seal with the appropriate drape.  The right upper extremities then prepped and draped in normal sterile fashion.  A timeout was called the correct site was identified the procedure then began.  Attention then turned to the right hand a longitude incision made directly the dorsal aspect of the fourth dorsal compartment.  Dissection carried down through the skin and subcutaneous tissue.  The fourth dorsal  compartment did have the inflammatory changes in predominant tenosynovitis over the fourth dorsal compartment.  Radical tenosynovitis was then carried out of the fourth dorsal compartment.  Tissue was sent for pathology.  After tenosynovectomy inspection of the extensor tendons were redone.  The patient did have the rupture of the EDC to the small finger and ring finger.  The tendon edges were then carefully debrided back.  Side-to-side tendon transfer was then carried out from the ring finger to the long finger.  The patient did have an intact juncture of distally.  Following this this was repaired side-to-side with 4-0 Ethibond suture.  Portion of the tendon was also weaved proximally in a Pulvertaft weave fashion to complete the tendon transfer.  Once this was carried out attention was then turned to the small finger the extensor indices proprius was then harvested through the same incision.  This was the ulnar tendon adjacent to the [facility].  This was careful identified proximally.  After the EIP was harvested this was then transferred through the fourth dorsal compartment retinaculum to the ulnar aspect of the hand.  The [facility] and EDQ were then side sewn side-to-side.  Once this was carried out the EIP was then transferred in a Pulvertaft weave fashion to the [facility].  4-weaves were then passed.  This was reinforced with Ethibond suture proximally and distally.  This created a nice construct.  Once this was completed passive tenodesis was carried out and the patient had good extension with wrist flexion good cascade of the fingers following tendon transfers.  The wound was then thoroughly irrigated.  Following this the subcutaneous tissues were then closed with Monocryl.  The skin was then closed using horizontal mattress Prolene sutures.  Adaptic dressing sterile compressive bandage then applied.  The patient was then placed in a well-padded volar splint keeping the fingers in full extension.  Patient tolerated the  procedure well.  POSTOPERATIVE PLAN: Patient be discharged to home.  See her back in the office in 2 weeks for wound check suture removal down to see our therapist for postoperative tendon transfer protocol.

## 2023-12-10 NOTE — Transfer of Care (Signed)
 Immediate Anesthesia Transfer of Care Note  Patient: Joyce Watts  Procedure(s) Performed: TRANSFER, TENDON (Right: Arm Lower) TENOSYNOVECTOMY (Right: Arm Lower)  Patient Location: PACU  Anesthesia Type:General  Level of Consciousness: drowsy  Airway & Oxygen Therapy: Patient Spontanous Breathing and Patient connected to face mask oxygen  Post-op Assessment: Report given to RN and Post -op Vital signs reviewed and stable  Post vital signs: Reviewed and stable  Last Vitals:  Vitals Value Taken Time  BP 108/57 12/10/23 1845  Temp 36.3 C 12/10/23 1845  Pulse 73 12/10/23 1845  Resp 14 12/10/23 1845  SpO2 96 % 12/10/23 1845  Vitals shown include unfiled device data.  Last Pain:  Vitals:   12/10/23 1915  TempSrc:   PainSc: 3       Patients Stated Pain Goal: 3 (12/10/23 1900)  Complications: No notable events documented.

## 2023-12-11 ENCOUNTER — Encounter (HOSPITAL_COMMUNITY): Payer: Self-pay | Admitting: Orthopedic Surgery

## 2023-12-12 LAB — SURGICAL PATHOLOGY

## 2024-01-01 ENCOUNTER — Other Ambulatory Visit (HOSPITAL_COMMUNITY): Payer: Self-pay

## 2024-01-14 ENCOUNTER — Other Ambulatory Visit (HOSPITAL_COMMUNITY): Payer: Self-pay

## 2024-01-14 MED ORDER — ALPRAZOLAM 0.5 MG PO TABS
0.5000 mg | ORAL_TABLET | ORAL | 0 refills | Status: DC
  Filled 2024-01-14: qty 40, 14d supply, fill #0

## 2024-01-19 ENCOUNTER — Other Ambulatory Visit (HOSPITAL_COMMUNITY): Payer: Self-pay

## 2024-01-20 NOTE — Progress Notes (Signed)
 Office Visit Note  Patient: Joyce Watts             Date of Birth: 1960/11/09           MRN: [id number]             PCP: Roselind Congo, MD Referring: Roselind Congo, MD Visit Date: 02/03/2024   Subjective:  Follow-up   Discussed the use of AI scribe software for clinical note transcription with the patient, who gave verbal consent to proceed.  History of Present Illness   Joyce Watts is a 63 year old female with rheumatoid arthritis who presents for post-surgical follow-up for tendon rupture and synovitis.  Approximately eight weeks ago, she underwent surgery for a ruptured tendon in her hand. Initially planned for one finger, the surgery was extended to two fingers due to a rupture in the ring finger. She continues to experience swelling and limited use of her fingers, significantly impacting her daily activities.  She has a history of rheumatoid arthritis, contributing to tendon ruptures and swelling in her fingers. Multiple fingers have been affected by tendon ruptures. She also reports swelling in her wrist, and synovitis was removed during her operation, described as fibrous upon testing.  Post-surgery, she experiences pain primarily in the wrist where the synovitis was removed. She uses prednisone  intermittently to manage inflammation and pain, taking one to two tablets daily depending on symptom severity. Her grandmother also had long-term use of prednisone .  She is concerned about potential hair loss with methotrexate  treatment, having experienced significant hair thinning following previous medical treatments, including knee surgery. She notes very little hair growth on her legs, which she attributes to her current condition or treatment.  Her job involves significant use of her hands, which she acknowledges could contribute to her symptoms, although she notes that her level of tendon rupture is unusual compared to others with similar hand usage.       Previous  HPI 12/05/2023 Joyce Watts is a 63 year old female with Sjogren's syndrome who presents with hand swelling and tendon issues. She was referred by her primary care physician to a surgeon after an x-ray showed tendon damage.   She has been experiencing swelling in her entire hand for about a month and a half, starting in February. The swelling is accompanied by significant pain, especially at the end of the day. She noticed a knot on the back of her hand, which led her to visit her primary care physician. An x-ray revealed tendon damage and a blackened area on the end of her ring finger. Despite attempts to manage the swelling and pain with home remedies, these symptoms have persisted. She has been using tape to keep her finger straight, which she finds somewhat helpful.   She has undergone surgery on two places and is scheduled for another surgery to swap a tendon from one finger to the little finger to allow it to straighten, and to clean up inflammation in the wrist. The swelling and pain have persisted despite these interventions.   She has a history of Sjogren's syndrome and has been experiencing symptoms that are typically seen in rheumatoid arthritis, such as joint inflammation and tendon issues. She has not been on any medication for Sjogren's syndrome. Her lab tests have shown high antibody titers, but other inflammation markers have been normal.   She has a rash around her eyes that clears up with medication from her eye doctor. She associates the rash  with stress and allergies. She has experienced significant life stressors recently, including losing her job in January.   She discovered two torn tendons in her left shoulder, which have been painful when lifting her arm, especially when carrying weight.      Previous HPI 06/05/23 Joyce Watts is a 63 y.o. female here for follow up for Sjogren's syndrome and osteoarthritis.  At her last visit we did a right wrist injection for severely  increased pain and stiffness symptoms did improve although there was some waning benefit she feels is now down to about her baseline level of joint pain and stiffness.  Not having any persistent numbness.  She still has stiffness usually worst in the shoulders and the hips first thing in the morning also has pain at night that requires her to shift positions usually at least 2 or 3 times per night awakening.  Continues using pilocarpine  for dry mouth also Act dry mouth products are helpful. She keeps frequent bruising on her legs most commonly at the lower anterior shin on her right leg also has a much larger area of bruising since returning from a recent trip last month.   02/25/2023 Joyce Watts is a 63 y.o. female here for follow up for Sjogren's syndrome and osteoarthritis. Increased pain and stiffness. Worse since weeks ago, has gradually improved part ways. She resumed taking naproxen nightly for this. She also feels weakness or loss of precision with her hands. Pain and stiffness often worse at night and early morning. Also feels sore in the forearm muscles near elbow. This does not feel similar to previous carpal tunnel symptoms.   12/04/2022 Joyce Watts is a 63 y.o. female here for follow up for Sjogren's syndrome and osteoarthritis.  At her last visit we did right shoulder steroid injection she felt a good improvement in symptoms and has stayed better since that time.  Getting a little bit of pain with some full overhead movement but much better than before.  Still has dry mouth symptoms she is taking the pilocarpine  at nighttime which is helpful.  No new ulcers or lesions no thrush.  She recently took course of Augmentin  for suspected bacterial sinus infection though is still having drainage problems again afterwards.  Thinks this may be more allergy related she does have history of environmental allergies.  Also with facial rashes that are itchy sometimes flaky around her eyes and face she is  treating this with cetaphil and taking Allegra for allergies.  She is also noticed this rash can be more pronounced when stressed out.  She is very stressed and getting sleep deprivation when looking after her mother who is seriously ill and end-of-life.  Had a few more episodes with bluish finger discoloration but no associated lesions or skin peeling.  Taking Tylenol  as needed for hand osteoarthritis not every day.   09/04/22 Joyce Watts is a 63 y.o. female here for follow up for sjogren syndrome and osteoarthritis. She had right knee replacement surgery in November this was uneventful and has been progressing well with rehab so far. Flexion ROM is still improving. She is no longer taking any NSAIDs regularly, takes tylenol  arthritis as needed which is partially helpful. Her left thumb still hurts sometimes but worst area is the right shoulder. Still having dryness symptoms uses drops occasionally, her mouth is a bigger problem sometimes with burning discomfort and tried adding a humidifier in the bedroom.   04/29/22 Joyce Watts is a 63 y.o.  female here for follow up for evaluation of joint pain in multiple areas especially with concern of increasing stiffness and intermittent swelling in the bilateral hands. She saw Dr. Brunilda Capra for follow up and planning to schedule right knee replacement once approved through insurance. Joint pains in hands remains worst around the base of the thumb area. She bought some OTC eye drops for dryness. Also notices some persistent eye swelling or irritation after styling eye lashes.   Previous HPI 04/12/2022 Joyce Watts is a 63 y.o. female here for evaluation of joint pain in multiple areas especially with concern of increasing stiffness and intermittent swelling in the bilateral hands.  She has longstanding history of knee osteoarthritis has had steroid and viscosupplementation injections with Dr. Brunilda Capra.  Also has some chronic joint pain and frequent popping in her  shoulders and elbows and hands but this has worsened.  She had bilateral carpal tunnel release surgery last year due to progressive worsening of sensory changes and had a good relief of symptoms after these.  However in about the past 6 months she has been experiencing worsening pain and stiffness in both hands also in the right shoulder and elbow.  She notices intermittent swelling or puffiness throughout the hands often lasting up to 1 or 2 hours daily.  She tried taking meloxicam  for inflammation with no appreciable benefit although switching to twice daily naproxen she has seen partial improvement in symptoms when taking this.  She notices locking up sensation pretty frequently throughout the right arm and in fingers of both hands.  The locking up of fingers is often most severe at night or first thing in the morning.  She had some laboratory testing for this with a elevated sedimentation rate. Besides her joint pains she does feel some persistent fatigue and gets short of breath more easily with activity than normal for her.  She has chronic dryness of eyes and mouth.  Does not take any specific medications for this.     Imaging reviewed 08/28/23 MRI Left Humerus IMPRESSION: 1. A 10 x 8 x 72 mm intramedullary bone lesion in the proximal humeral diaphysis most compatible with a benign enchondroma. 2. Complete tear of the supraspinatus and infraspinatus tendons.   07/10/23 CXR IMPRESSION: 1. No active cardiopulmonary disease. 2. Sclerotic lesion in the shaft of the left humerus, possibly representing infarct or chondroid lesion. Correlate for any symptoms of pain   Labs reviewed 06/2023 Myositis Ab panel neg UPEP neg   02/2023 ESR 28 CRP 4.4 CK 315   04/2022 ANA 1:1280 speckled dsDNA, SSA, SSB neg CCP neg ESR 19 CRP 5.6 Complement C3/C4 wnl   Review of Systems  Constitutional:  Positive for fatigue.  HENT:  Positive for mouth dryness. Negative for mouth sores.   Eyes:   Positive for dryness.  Respiratory:  Negative for shortness of breath.   Cardiovascular:  Negative for chest pain and palpitations.  Gastrointestinal:  Positive for constipation. Negative for blood in stool and diarrhea.  Endocrine: Negative for increased urination.  Genitourinary:  Negative for involuntary urination.  Musculoskeletal:  Positive for joint pain, gait problem, joint pain, joint swelling, myalgias, morning stiffness and myalgias. Negative for muscle weakness and muscle tenderness.  Skin:  Positive for rash and sensitivity to sunlight. Negative for color change and hair loss.  Allergic/Immunologic: Negative for susceptible to infections.  Neurological:  Positive for headaches. Negative for dizziness.  Hematological:  Negative for swollen glands.  Psychiatric/Behavioral:  Positive for sleep disturbance. Negative for  depressed mood. The patient is not nervous/anxious.     PMFS History:  Patient Active Problem List   Diagnosis Date Noted   Angular cheilitis 02/03/2024   Extensor tendon rupture of hand, right, subsequent encounter 12/05/2023   Rheumatoid arthritis (HCC) 12/05/2023   Generalized osteoarthritis of multiple sites 09/10/2023   Elevated CK 06/05/2023   Carpal tunnel syndrome 02/25/2023   Dermatitis of face 02/25/2023   Fatigue 02/25/2023   High risk medication use 04/29/2022   Sjogren's disease (HCC) 04/29/2022   Bilateral hand pain 04/12/2022   Pain in right shoulder 04/12/2022   Elevated sed rate 04/12/2022   Other malaise and fatigue 04/21/2014   Secondary hypothyroidism 04/21/2014   Left thyroid  nodule 04/21/2014    Past Medical History:  Diagnosis Date   Anxiety    Asthma    Carpal tunnel syndrome    Depression    Hyperlipidemia    LDL in 6/15 = 133   Hypothyroidism    Dr. Hubert Madden   Migraines    Past history    Family History  Problem Relation Age of Onset   Dementia Mother    High Cholesterol Mother    Hypertension Father    Diabetes Father     Bladder Cancer Father    Myasthenia gravis Father    Diabetes Maternal Grandfather    Cancer Paternal Grandmother    Diabetes Paternal Grandfather    Thyroid  disease Neg Hx    Past Surgical History:  Procedure Laterality Date   CARPAL TUNNEL RELEASE Right 01/24/2021   CARPAL TUNNEL RELEASE Left 02/12/2021   KNEE ARTHROPLASTY     REPLACEMENT TOTAL KNEE Right    SPINAL CORD DECOMPRESSION  09/05/1992   C1   TENDON TRANSFER Right 12/10/2023   Procedure: TRANSFER, TENDON;  Surgeon: Arvil Birks, MD;  Location: [facility] OR;  Service: Orthopedics;  Laterality: Right;   TENOSYNOVECTOMY Right 12/10/2023   Procedure: TENOSYNOVECTOMY;  Surgeon: Arvil Birks, MD;  Location: [facility] OR;  Service: Orthopedics;  Laterality: Right;   TONSILECTOMY/ADENOIDECTOMY WITH MYRINGOTOMY N/A 1993   Social History   Social History Narrative   Not on file   Immunization History  Administered Date(s) Administered   Influenza, Quadrivalent, Recombinant, Inj, Pf 06/10/2019, 06/12/2020   Influenza,inj,Quad PF,6+ Mos 06/16/2014, 05/24/2016, 08/20/2017   Influenza-Unspecified 09/19/2015, 06/10/2019, 06/19/2021   PFIZER(Purple Top)SARS-COV-2 Vaccination 11/19/2019, 12/15/2019, 08/28/2020     Objective: Vital Signs: BP 132/73 (BP Location: Left Arm, Patient Position: Sitting, Cuff Size: Normal)   Pulse 76   Resp 14   Ht 5' 2 (1.575 m)   Wt 159 lb (72.1 kg)   BMI 29.08 kg/m    Physical Exam  Eyes:     Conjunctiva/sclera: Conjunctivae normal.    Cardiovascular:     Rate and Rhythm: Normal rate and regular rhythm.  Pulmonary:     Effort: Pulmonary effort is normal.     Breath sounds: Normal breath sounds.  Lymphadenopathy:     Cervical: No cervical adenopathy.   Skin:    General: Skin is warm and dry.   Neurological:     Mental Status: She is alert.   Psychiatric:        Mood and Affect: Mood normal.      Musculoskeletal Exam:  Shoulders full ROM, pain with overhead movement Elbows full ROM no  tenderness or swelling Right wrist wearing brace, swelling present throughout the hand including at PIP joints Left hand MCP and PIP joint swelling with some tenderness no significant decree strength or  range of motion Knees full ROM no tenderness or swelling Ankles full ROM no tenderness or swelling   Investigation: No additional findings.  Imaging: No results found.  Recent Labs: Lab Results  Component Value Date   WBC 8.1 12/05/2023   HGB 12.3 12/05/2023   PLT 308 12/05/2023   NA 143 12/05/2023   K 3.9 12/05/2023   CL 105 12/05/2023   CO2 30 12/05/2023   GLUCOSE 72 12/05/2023   BUN 17 12/05/2023   CREATININE 0.75 12/05/2023   BILITOT 0.4 12/05/2023   AST 20 12/05/2023   ALT 10 12/05/2023   PROT 6.5 12/05/2023   CALCIUM 9.4 12/05/2023    Speciality Comments: No specialty comments available.  Procedures:  No procedures performed Allergies: Bacitracin, Neomycin , Bacitracin-polymyxin b, and Sulfa antibiotics   Assessment / Plan:     Visit Diagnoses: Rheumatoid arthritis involving right hand, unspecified whether rheumatoid factor present (HCC) Chronic rheumatoid arthritis exacerbation led to tendon rupture in the hand. Prednisone  is not suitable for long-term use. Methotrexate  considered for long-term management with potential side effects discussed. - Start methotrexate  15 mg p.o. weekly folic acid  1 mg daily - Continue prednisone  as needed for acute inflammation. - Monitor for methotrexate  side effects, including gastrointestinal upset and hair loss.  High risk medication use - Methotrexate  15 mg PO weekly, follic acid 1 mg daily timing as above Discuss potential side effects of methotrexate , including hair thinning which is the patient's biggest concern, and consider folic acid  or biotin supplementation.  Also discussed more systemic issues such as risk for cytopenia hepatotoxicity or infections and need for regular lab monitoring and limiting alcohol use.  Swelling  of hand and fingers Synovitis of wrist Persistent swelling related to rheumatoid arthritis and recent surgery, limiting range of motion. - Continue physical therapy to improve hand function and reduce swelling. - Use prednisone  as needed for acute swelling and inflammation.   Orders: No orders of the defined types were placed in this encounter.  Meds ordered this encounter  Medications   methotrexate  (RHEUMATREX) 2.5 MG tablet    Sig: Take 6 tablets (15 mg total) by mouth once a week. Caution:Chemotherapy. Protect from light.    Dispense:  30 tablet    Refill:  2   folic acid  (FOLVITE ) 1 MG tablet    Sig: Take 1 tablet (1 mg total) by mouth daily.    Dispense:  90 tablet    Refill:  1     Follow-Up Instructions: Return in about 3 months (around 05/05/2024) for RA GC/MTX start f/u 3mos.   Matt Song, MD  Note - This record has been created using [employer].  Chart creation errors have been sought, but may not always  have been located. Such creation errors do not reflect on  the standard of medical care.

## 2024-01-21 ENCOUNTER — Other Ambulatory Visit (HOSPITAL_COMMUNITY): Payer: Self-pay

## 2024-01-21 MED ORDER — BUPROPION HCL ER (XL) 300 MG PO TB24
300.0000 mg | ORAL_TABLET | Freq: Every day | ORAL | 1 refills | Status: DC
  Filled 2024-01-21 – 2024-01-22 (×2): qty 30, 30d supply, fill #0
  Filled 2024-02-25: qty 30, 30d supply, fill #1
  Filled 2024-03-20: qty 30, 30d supply, fill #2
  Filled 2024-04-08 – 2024-04-16 (×2): qty 30, 30d supply, fill #3
  Filled 2024-05-26: qty 30, 30d supply, fill #4
  Filled 2024-06-08: qty 30, 30d supply, fill #5

## 2024-01-22 ENCOUNTER — Encounter (HOSPITAL_COMMUNITY): Payer: Self-pay

## 2024-01-22 ENCOUNTER — Other Ambulatory Visit (HOSPITAL_COMMUNITY): Payer: Self-pay

## 2024-02-03 ENCOUNTER — Other Ambulatory Visit: Payer: Self-pay

## 2024-02-03 ENCOUNTER — Encounter: Payer: Self-pay | Admitting: Internal Medicine

## 2024-02-03 ENCOUNTER — Ambulatory Visit: Attending: Internal Medicine | Admitting: Internal Medicine

## 2024-02-03 ENCOUNTER — Other Ambulatory Visit (HOSPITAL_COMMUNITY): Payer: Self-pay

## 2024-02-03 VITALS — BP 132/73 | HR 76 | Resp 14 | Ht 62.0 in | Wt 159.0 lb

## 2024-02-03 DIAGNOSIS — S66811D Strain of other specified muscles, fascia and tendons at wrist and hand level, right hand, subsequent encounter: Secondary | ICD-10-CM

## 2024-02-03 DIAGNOSIS — Z7952 Long term (current) use of systemic steroids: Secondary | ICD-10-CM

## 2024-02-03 DIAGNOSIS — M79642 Pain in left hand: Secondary | ICD-10-CM

## 2024-02-03 DIAGNOSIS — Z79899 Other long term (current) drug therapy: Secondary | ICD-10-CM | POA: Diagnosis not present

## 2024-02-03 DIAGNOSIS — M3509 Sicca syndrome with other organ involvement: Secondary | ICD-10-CM

## 2024-02-03 DIAGNOSIS — M069 Rheumatoid arthritis, unspecified: Secondary | ICD-10-CM

## 2024-02-03 DIAGNOSIS — M79641 Pain in right hand: Secondary | ICD-10-CM

## 2024-02-03 DIAGNOSIS — K130 Diseases of lips: Secondary | ICD-10-CM

## 2024-02-03 MED ORDER — FOLIC ACID 1 MG PO TABS
1.0000 mg | ORAL_TABLET | Freq: Every day | ORAL | 1 refills | Status: DC
  Filled 2024-02-03: qty 30, 30d supply, fill #0
  Filled 2024-02-25 – 2024-02-27 (×3): qty 30, 30d supply, fill #1

## 2024-02-03 MED ORDER — METHOTREXATE SODIUM 2.5 MG PO TABS
15.0000 mg | ORAL_TABLET | ORAL | 2 refills | Status: DC
Start: 2024-02-03 — End: 2024-04-20
  Filled 2024-02-03: qty 30, 30d supply, fill #0
  Filled 2024-02-25 – 2024-02-27 (×3): qty 30, 30d supply, fill #1

## 2024-02-03 NOTE — Patient Instructions (Addendum)
 Your rash around the mouth looks most consistent with angular cheilitis, which can be caused by several things ranging from dryness, nutritional deficiency, or mild skin infections.  We will plan to recheck your lab tests in about 1 month locally for monitoring the methotrexate.

## 2024-02-04 ENCOUNTER — Other Ambulatory Visit: Payer: Self-pay

## 2024-02-06 ENCOUNTER — Other Ambulatory Visit (HOSPITAL_COMMUNITY): Payer: Self-pay

## 2024-02-16 ENCOUNTER — Other Ambulatory Visit (HOSPITAL_COMMUNITY): Payer: Self-pay

## 2024-02-17 ENCOUNTER — Other Ambulatory Visit (HOSPITAL_COMMUNITY): Payer: Self-pay

## 2024-02-17 MED ORDER — MONTELUKAST SODIUM 10 MG PO TABS
10.0000 mg | ORAL_TABLET | Freq: Every day | ORAL | 5 refills | Status: DC
  Filled 2024-02-17: qty 30, 30d supply, fill #0
  Filled 2024-03-13: qty 30, 30d supply, fill #1
  Filled 2024-04-04 – 2024-04-16 (×2): qty 30, 30d supply, fill #2
  Filled 2024-05-09 – 2024-05-11 (×2): qty 30, 30d supply, fill #3
  Filled 2024-06-08: qty 30, 30d supply, fill #4
  Filled 2024-07-06: qty 30, 30d supply, fill #5

## 2024-02-25 ENCOUNTER — Other Ambulatory Visit: Payer: Self-pay

## 2024-02-25 ENCOUNTER — Other Ambulatory Visit (HOSPITAL_COMMUNITY): Payer: Self-pay

## 2024-02-25 ENCOUNTER — Encounter: Payer: Self-pay | Admitting: Internal Medicine

## 2024-02-25 MED ORDER — AMPHETAMINE-DEXTROAMPHETAMINE 10 MG PO TABS
10.0000 mg | ORAL_TABLET | Freq: Two times a day (BID) | ORAL | 0 refills | Status: AC
  Filled 2024-02-25: qty 60, 30d supply, fill #0

## 2024-02-26 ENCOUNTER — Other Ambulatory Visit (HOSPITAL_COMMUNITY): Payer: Self-pay

## 2024-02-26 ENCOUNTER — Other Ambulatory Visit: Payer: Self-pay

## 2024-02-26 ENCOUNTER — Encounter: Payer: Self-pay | Admitting: Internal Medicine

## 2024-02-27 ENCOUNTER — Other Ambulatory Visit: Payer: Self-pay

## 2024-02-28 ENCOUNTER — Encounter: Payer: Self-pay | Admitting: Internal Medicine

## 2024-02-28 DIAGNOSIS — L309 Dermatitis, unspecified: Secondary | ICD-10-CM

## 2024-02-28 DIAGNOSIS — M069 Rheumatoid arthritis, unspecified: Secondary | ICD-10-CM

## 2024-02-28 DIAGNOSIS — R21 Rash and other nonspecific skin eruption: Secondary | ICD-10-CM

## 2024-03-01 ENCOUNTER — Encounter (HOSPITAL_COMMUNITY): Payer: Self-pay

## 2024-03-01 ENCOUNTER — Other Ambulatory Visit: Payer: Self-pay

## 2024-03-01 ENCOUNTER — Other Ambulatory Visit (HOSPITAL_COMMUNITY): Payer: Self-pay

## 2024-03-01 MED ORDER — TRIAMCINOLONE ACETONIDE 0.1 % EX CREA
1.0000 | TOPICAL_CREAM | Freq: Two times a day (BID) | CUTANEOUS | 0 refills | Status: AC | PRN
  Filled 2024-03-01 (×2): qty 30, 15d supply, fill #0

## 2024-03-03 ENCOUNTER — Other Ambulatory Visit: Payer: Self-pay

## 2024-03-04 ENCOUNTER — Ambulatory Visit (HOSPITAL_BASED_OUTPATIENT_CLINIC_OR_DEPARTMENT_OTHER)
Admission: EM | Admit: 2024-03-04 | Discharge: 2024-03-04 | Disposition: A | Discharge status: Home / Self Care | Attending: Family Medicine | Admitting: Family Medicine

## 2024-03-04 ENCOUNTER — Encounter (HOSPITAL_BASED_OUTPATIENT_CLINIC_OR_DEPARTMENT_OTHER): Payer: Self-pay | Admitting: Emergency Medicine

## 2024-03-04 DIAGNOSIS — B029 Zoster without complications: Secondary | ICD-10-CM

## 2024-03-04 MED ORDER — VALACYCLOVIR HCL 1 G PO TABS: 1000.0000 mg | ORAL_TABLET | Freq: Three times a day (TID) | ORAL | 0 refills | Status: AC

## 2024-03-04 NOTE — ED Provider Notes (Signed)
 [facility]    CSN: [id number] Arrival date & time: 03/04/24  1826      History   Chief Complaint Chief Complaint  Patient presents with   Rash    HPI Joyce Watts is a 63 y.o. female.   Pt is a 63 year old female that presents with rash. Rash started 1 week ago in the left groin area and is now extending around to the left lower  back. Rash is itchy and painful.   Recently diagnosed with arthritis started methotrexate  week and half ago. Patient and doctor thought this was originally medication reaction. But after researching the internet patient feels like could be shingles. No fever but has felt chilled. Was instructed cortisone cream which is not helping.     Rash   Past Medical History:  Diagnosis Date   Anxiety    Asthma    Carpal tunnel syndrome    Depression    Hyperlipidemia    LDL in 6/15 = 133   Hypothyroidism    Dr. Von   Migraines    Past history    Patient Active Problem List   Diagnosis Date Noted   Angular cheilitis 02/03/2024   Extensor tendon rupture of hand, right, subsequent encounter 12/05/2023   Rheumatoid arthritis (HCC) 12/05/2023   Generalized osteoarthritis of multiple sites 09/10/2023   Elevated CK 06/05/2023   Carpal tunnel syndrome 02/25/2023   Dermatitis of face 02/25/2023   Fatigue 02/25/2023   High risk medication use 04/29/2022   Sjogren's disease (HCC) 04/29/2022   Bilateral hand pain 04/12/2022   Pain in right shoulder 04/12/2022   Elevated sed rate 04/12/2022   Other malaise and fatigue 04/21/2014   Secondary hypothyroidism 04/21/2014   Left thyroid  nodule 04/21/2014    Past Surgical History:  Procedure Laterality Date   CARPAL TUNNEL RELEASE Right 01/24/2021   CARPAL TUNNEL RELEASE Left 02/12/2021   KNEE ARTHROPLASTY     REPLACEMENT TOTAL KNEE Right    SPINAL CORD DECOMPRESSION  09/05/1992   C1   TENDON TRANSFER Right 12/10/2023   Procedure: TRANSFER, TENDON;  Surgeon: Shari Easter, MD;  Location: [facility]  OR;  Service: Orthopedics;  Laterality: Right;   TENOSYNOVECTOMY Right 12/10/2023   Procedure: TENOSYNOVECTOMY;  Surgeon: Shari Easter, MD;  Location: [facility] OR;  Service: Orthopedics;  Laterality: Right;   TONSILECTOMY/ADENOIDECTOMY WITH MYRINGOTOMY N/A 1993    OB History   No obstetric history on file.      Home Medications    Prior to Admission medications   Medication Sig Start Date End Date Taking? Authorizing Provider  valACYclovir  (VALTREX ) 1000 MG tablet Take 1 tablet (1,000 mg total) by mouth 3 (three) times daily for 7 days. 03/04/24 03/11/24 Yes Roniel Halloran A, FNP  albuterol  (VENTOLIN  HFA) 108 (90 Base) MCG/ACT inhaler Inhale 1 puff into the lungs every 4 (four) hours as needed. 12/01/23     ALPRAZolam  (XANAX ) 0.5 MG tablet Take 1 tablet (0.5 mg total) by mouth every 8-12 (eight-twelve) hours as needed. 10/10/23     ALPRAZolam  (XANAX ) 0.5 MG tablet Take 1 tablet by mouth every 8-12 hours as needed. 01/14/24     amphetamine -dextroamphetamine  (ADDERALL) 10 MG tablet Take 1 tablet (10 mg total) by mouth 2 (two) times daily. 02/25/24     ascorbic acid (VITAMIN C) 500 MG tablet Take 500 mg by mouth daily.    [provider]  buPROPion  (WELLBUTRIN  XL) 300 MG 24 hr tablet Take 1 tablet (300 mg total) by mouth  daily. 01/21/24     folic acid  (FOLVITE ) 1 MG tablet Take 1 tablet (1 mg total) by mouth daily. 02/03/24   Jeannetta Lonni ORN, MD  hydrochlorothiazide  (HYDRODIURIL ) 25 MG tablet Take 1 tablet (25 mg total) by mouth every morning as needed 09/02/23     levothyroxine  (SYNTHROID ) 75 MCG tablet Take 1 tablet (75 mcg total) by mouth daily. 10/31/23   Thapa, [place], MD  methotrexate  (RHEUMATREX) 2.5 MG tablet Take 6 tablets (15 mg total) by mouth once a week. Caution:Chemotherapy. Protect from light. 02/03/24   Jeannetta Lonni ORN, MD  montelukast  (SINGULAIR ) 10 MG tablet Take 1 tablet (10 mg total) by mouth daily for 30 days 02/17/24     Multiple Vitamin (MULTIVITAMIN) capsule Take 1 capsule by  mouth daily.    [provider]  mupirocin  ointment (BACTROBAN ) 2 % Apply 1 application topically 3 times/day as needed-between meals & bedtime.    [provider]  neomycin -polymyxin-dexameth (MAXITROL ) 0.1 % OINT Apply 1 a thin layer on eyelid twice a day Patient not taking: Reported on 02/03/2024 06/11/23     pilocarpine  (SALAGEN ) 5 MG tablet Take 1 tablet (5 mg total) by mouth 2 (two) times daily as needed. 09/05/23   Jeannetta Lonni ORN, MD  predniSONE  (DELTASONE ) 5 MG tablet Take 1-2 tablets (5-10 mg total) by mouth daily for joint pain and swelling 12/05/23   Rice, Lonni ORN, MD  rizatriptan (MAXALT) 10 MG tablet Take 10 mg by mouth daily as needed for migraine. Take 1 tablet by mouth once daily as needed.    [provider]  triamcinolone  cream (KENALOG ) 0.1 % Apply 1 Application topically 2 (two) times daily as needed. 03/01/24   Jeannetta Lonni ORN, MD  valACYclovir  (VALTREX ) 1000 MG tablet Take 2 tablets by mouth twice a day x 1 day per episode 05/02/21       Family History Family History  Problem Relation Age of Onset   Dementia Mother    High Cholesterol Mother    Hypertension Father    Diabetes Father    Bladder Cancer Father    Myasthenia gravis Father    Diabetes Maternal Grandfather    Cancer Paternal Grandmother    Diabetes Paternal Grandfather    Thyroid  disease Neg Hx     Social History Social History   Tobacco Use   Smoking status: Never    Passive exposure: Never   Smokeless tobacco: Never  Vaping Use   Vaping status: Never Used  Substance Use Topics   Alcohol use: Not Currently   Drug use: Never     Allergies   Bacitracin, Neomycin , Bacitracin-polymyxin b, and Sulfa antibiotics   Review of Systems Review of Systems  Skin:  Positive for rash.    See HPI Physical Exam Triage Vital Signs ED Triage Vitals  Encounter Vitals Group     BP 03/04/24 1839 131/85     Girls Systolic BP Percentile --      Girls Diastolic BP  Percentile --      Boys Systolic BP Percentile --      Boys Diastolic BP Percentile --      Pulse Rate 03/04/24 1839 78     Resp 03/04/24 1839 17     Temp 03/04/24 1839 98.5 F (36.9 C)     Temp Source 03/04/24 1839 Oral     SpO2 03/04/24 1839 98 %     Weight --      Height --      Head  Circumference --      Peak Flow --      Pain Score 03/04/24 1837 4     Pain Loc --      Pain Education --      Exclude from Growth Chart --    No data found.  Updated Vital Signs BP 131/85 (BP Location: Right Arm)   Pulse 78   Temp 98.5 F (36.9 C) (Oral)   Resp 17   SpO2 98%   Visual Acuity Right Eye Distance:   Left Eye Distance:   Bilateral Distance:    Right Eye Near:   Left Eye Near:    Bilateral Near:     Physical Exam Vitals and nursing note reviewed.  Constitutional:      General: She is not in acute distress.    Appearance: Normal appearance. She is not ill-appearing, toxic-appearing or diaphoretic.  Pulmonary:     Effort: Pulmonary effort is normal.  Skin:    General: Skin is warm and dry.     Findings: Rash present.     Comments: See picture for detail   Neurological:     Mental Status: She is alert.  Psychiatric:        Mood and Affect: Mood normal.      UC Treatments / Results  Labs (all labs ordered are listed, but only abnormal results are displayed) Labs Reviewed - No data to display  EKG   Radiology No results found.  Procedures Procedures (including critical care time)  Medications Ordered in UC Medications - No data to display  Initial Impression / Assessment and Plan / UC Course  I have reviewed the triage vital signs and the nursing notes.  Pertinent labs & imaging results that were available during my care of the patient were reviewed by me and considered in my medical decision making (see chart for details).     Herpes Zoster- treating with Valtrex . Recommend Tylenol  and Ibuprofen for pain as needed.  Keep covered if oozing.   Follow up as needed.  Final Clinical Impressions(s) / UC Diagnoses   Final diagnoses:  Herpes zoster without complication     Discharge Instructions      Take the Valtrex  as prescribed for shingles You can take Tylenol  and ibuprofen for pain as needed.  Follow-up as needed     ED Prescriptions     Medication Sig Dispense Auth. Provider   valACYclovir  (VALTREX ) 1000 MG tablet Take 1 tablet (1,000 mg total) by mouth 3 (three) times daily for 7 days. 21 tablet Adah Wilbert LABOR, FNP      PDMP not reviewed this encounter.   Adah Wilbert LABOR, FNP 03/06/24 1330

## 2024-03-04 NOTE — Discharge Instructions (Signed)
 Take the Valtrex  as prescribed for shingles You can take Tylenol  and ibuprofen for pain as needed.  Follow-up as needed

## 2024-03-04 NOTE — ED Triage Notes (Signed)
 Pt has rash from groin to left side and up to back that started over a week ago.   Recently diagnosed with arthritis started methotrexate  week and half ago. Patient and doctor thought this was originally medication reaction. But after researching the internet patient feels like could be shingles.

## 2024-03-08 ENCOUNTER — Encounter: Payer: Self-pay | Admitting: Internal Medicine

## 2024-03-11 ENCOUNTER — Other Ambulatory Visit (HOSPITAL_COMMUNITY): Payer: Self-pay

## 2024-03-11 ENCOUNTER — Encounter

## 2024-03-11 ENCOUNTER — Other Ambulatory Visit: Payer: Self-pay

## 2024-03-11 MED ORDER — GABAPENTIN 100 MG PO CAPS
100.0000 mg | ORAL_CAPSULE | Freq: Every day | ORAL | 1 refills | Status: DC
  Filled 2024-03-11: qty 30, 10d supply, fill #0
  Filled 2024-03-16 – 2024-03-20 (×2): qty 30, 10d supply, fill #1

## 2024-03-13 ENCOUNTER — Other Ambulatory Visit (HOSPITAL_COMMUNITY): Payer: Self-pay

## 2024-03-16 ENCOUNTER — Other Ambulatory Visit (HOSPITAL_COMMUNITY): Payer: Self-pay

## 2024-03-17 ENCOUNTER — Other Ambulatory Visit (HOSPITAL_COMMUNITY): Payer: Self-pay

## 2024-03-17 ENCOUNTER — Other Ambulatory Visit: Payer: Self-pay

## 2024-03-17 MED ORDER — MUPIROCIN 2 % EX OINT
TOPICAL_OINTMENT | CUTANEOUS | 1 refills | Status: DC
  Filled 2024-03-17: qty 22, 5d supply, fill #0

## 2024-03-20 ENCOUNTER — Encounter (HOSPITAL_COMMUNITY): Payer: Self-pay

## 2024-03-22 ENCOUNTER — Other Ambulatory Visit: Payer: Self-pay

## 2024-03-22 ENCOUNTER — Other Ambulatory Visit (HOSPITAL_COMMUNITY): Payer: Self-pay

## 2024-04-05 ENCOUNTER — Other Ambulatory Visit (HOSPITAL_COMMUNITY): Payer: Self-pay

## 2024-04-08 ENCOUNTER — Other Ambulatory Visit (HOSPITAL_COMMUNITY): Payer: Self-pay

## 2024-04-08 ENCOUNTER — Other Ambulatory Visit: Payer: Self-pay

## 2024-04-08 MED ORDER — GABAPENTIN 100 MG PO CAPS
100.0000 mg | ORAL_CAPSULE | Freq: Every day | ORAL | 3 refills | Status: DC
  Filled 2024-04-08: qty 30, 10d supply, fill #0
  Filled 2024-05-09: qty 30, 10d supply, fill #1
  Filled 2024-05-26: qty 30, 10d supply, fill #2
  Filled 2024-06-08: qty 30, 10d supply, fill #3

## 2024-04-16 ENCOUNTER — Other Ambulatory Visit (HOSPITAL_COMMUNITY): Payer: Self-pay

## 2024-04-16 ENCOUNTER — Other Ambulatory Visit: Payer: Self-pay

## 2024-04-16 ENCOUNTER — Other Ambulatory Visit: Payer: Self-pay | Admitting: Internal Medicine

## 2024-04-16 DIAGNOSIS — M3509 Sicca syndrome with other organ involvement: Secondary | ICD-10-CM

## 2024-04-16 MED ORDER — PILOCARPINE HCL 5 MG PO TABS
5.0000 mg | ORAL_TABLET | Freq: Two times a day (BID) | ORAL | 5 refills | Status: AC | PRN
  Filled 2024-04-16: qty 60, 30d supply, fill #0
  Filled 2024-08-04: qty 60, 30d supply, fill #1
  Filled 2024-09-04: qty 180, 90d supply, fill #2

## 2024-04-16 NOTE — Telephone Encounter (Signed)
 Last Fill: 09/05/2023  Next Visit: 04/20/2024  Last Visit: 02/03/2024  Dx: not discussed  Current Dose per office note on 02/03/2024: not discussed  Okay to refill Pilocarpine ?

## 2024-04-19 ENCOUNTER — Other Ambulatory Visit (HOSPITAL_COMMUNITY): Payer: Self-pay

## 2024-04-19 ENCOUNTER — Other Ambulatory Visit: Payer: Self-pay

## 2024-04-19 MED ORDER — ALPRAZOLAM 0.5 MG PO TABS
0.5000 mg | ORAL_TABLET | ORAL | 0 refills | Status: DC | PRN
  Filled 2024-04-19: qty 40, 14d supply, fill #0

## 2024-04-19 NOTE — Progress Notes (Signed)
 Office Visit Note  Patient: Joyce Watts             Date of Birth: Nov 10, 1960           MRN: [id number]             PCP: Arloa Elsie SAUNDERS, MD Referring: Arloa Elsie SAUNDERS, MD Visit Date: 04/20/2024   Subjective:  Follow-up   Discussed the use of AI scribe software for clinical note transcription with the patient, who gave verbal consent to proceed.  History of Present Illness   Joyce Watts is a 63 year old female with rheumatoid arthritis who presents with joint pain and hand dysfunction.  She has ongoing issues with her hands and wrists, despite previous surgical intervention. After surgery, she is unable to fully extend her 4th and 5th impacting her ability to work which requires a  lot of typing. Occasional tenderness is present, and a new nodule has been noticed in the area. Flexion strength and range is normal.  She has a history of methotrexate  use, which worsened her condition.   A lump in her right cheek is associated with difficulty opening her mouth wide, affecting her ability to eat normally. A recent dental visit revealed a dead root without decay, leading to a need for a dental implant. The pain is intermittent, with no current pain but significant discomfort when it occurs.  She is scheduled for rotator cuff repair surgery on May 31, 2024, due to a split tendon.      Previous HPI 02/03/24 Joyce Watts is a 63 year old female with rheumatoid arthritis who presents for post-surgical follow-up for tendon rupture and synovitis.   Approximately eight weeks ago, she underwent surgery for a ruptured tendon in her hand. Initially planned for one finger, the surgery was extended to two fingers due to a rupture in the ring finger. She continues to experience swelling and limited use of her fingers, significantly impacting her daily activities.   She has a history of rheumatoid arthritis, contributing to tendon ruptures and swelling in her fingers. Multiple fingers  have been affected by tendon ruptures. She also reports swelling in her wrist, and synovitis was removed during her operation, described as fibrous upon testing.   Post-surgery, she experiences pain primarily in the wrist where the synovitis was removed. She uses prednisone  intermittently to manage inflammation and pain, taking one to two tablets daily depending on symptom severity. Her grandmother also had long-term use of prednisone .   She is concerned about potential hair loss with methotrexate  treatment, having experienced significant hair thinning following previous medical treatments, including knee surgery. She notes very little hair growth on her legs, which she attributes to her current condition or treatment.   Her job involves significant use of her hands, which she acknowledges could contribute to her symptoms, although she notes that her level of tendon rupture is unusual compared to others with similar hand usage.         Previous HPI 12/05/2023 Joyce Watts is a 63 year old female with Sjogren's syndrome who presents with hand swelling and tendon issues. She was referred by her primary care physician to a surgeon after an x-ray showed tendon damage.   She has been experiencing swelling in her entire hand for about a month and a half, starting in February. The swelling is accompanied by significant pain, especially at the end of the day. She noticed a knot on the back of her hand, which  led her to visit her primary care physician. An x-ray revealed tendon damage and a blackened area on the end of her ring finger. Despite attempts to manage the swelling and pain with home remedies, these symptoms have persisted. She has been using tape to keep her finger straight, which she finds somewhat helpful.   She has undergone surgery on two places and is scheduled for another surgery to swap a tendon from one finger to the little finger to allow it to straighten, and to clean up inflammation in  the wrist. The swelling and pain have persisted despite these interventions.   She has a history of Sjogren's syndrome and has been experiencing symptoms that are typically seen in rheumatoid arthritis, such as joint inflammation and tendon issues. She has not been on any medication for Sjogren's syndrome. Her lab tests have shown high antibody titers, but other inflammation markers have been normal.   She has a rash around her eyes that clears up with medication from her eye doctor. She associates the rash with stress and allergies. She has experienced significant life stressors recently, including losing her job in January.   She discovered two torn tendons in her left shoulder, which have been painful when lifting her arm, especially when carrying weight.      Previous HPI 06/05/23 Joyce Watts is a 63 y.o. female here for follow up for Sjogren's syndrome and osteoarthritis.  At her last visit we did a right wrist injection for severely increased pain and stiffness symptoms did improve although there was some waning benefit she feels is now down to about her baseline level of joint pain and stiffness.  Not having any persistent numbness.  She still has stiffness usually worst in the shoulders and the hips first thing in the morning also has pain at night that requires her to shift positions usually at least 2 or 3 times per night awakening.  Continues using pilocarpine  for dry mouth also Act dry mouth products are helpful. She keeps frequent bruising on her legs most commonly at the lower anterior shin on her right leg also has a much larger area of bruising since returning from a recent trip last month.   02/25/2023 Joyce Watts is a 63 y.o. female here for follow up for Sjogren's syndrome and osteoarthritis. Increased pain and stiffness. Worse since weeks ago, has gradually improved part ways. She resumed taking naproxen nightly for this. She also feels weakness or loss of precision with her  hands. Pain and stiffness often worse at night and early morning. Also feels sore in the forearm muscles near elbow. This does not feel similar to previous carpal tunnel symptoms.   12/04/2022 Joyce Watts is a 63 y.o. female here for follow up for Sjogren's syndrome and osteoarthritis.  At her last visit we did right shoulder steroid injection she felt a good improvement in symptoms and has stayed better since that time.  Getting a little bit of pain with some full overhead movement but much better than before.  Still has dry mouth symptoms she is taking the pilocarpine  at nighttime which is helpful.  No new ulcers or lesions no thrush.  She recently took course of Augmentin  for suspected bacterial sinus infection though is still having drainage problems again afterwards.  Thinks this may be more allergy related she does have history of environmental allergies.  Also with facial rashes that are itchy sometimes flaky around her eyes and face she is treating this with cetaphil  and taking Allegra for allergies.  She is also noticed this rash can be more pronounced when stressed out.  She is very stressed and getting sleep deprivation when looking after her mother who is seriously ill and end-of-life.  Had a few more episodes with bluish finger discoloration but no associated lesions or skin peeling.  Taking Tylenol  as needed for hand osteoarthritis not every day.   09/04/22 Joyce Watts is a 63 y.o. female here for follow up for sjogren syndrome and osteoarthritis. She had right knee replacement surgery in November this was uneventful and has been progressing well with rehab so far. Flexion ROM is still improving. She is no longer taking any NSAIDs regularly, takes tylenol  arthritis as needed which is partially helpful. Her left thumb still hurts sometimes but worst area is the right shoulder. Still having dryness symptoms uses drops occasionally, her mouth is a bigger problem sometimes with burning discomfort  and tried adding a humidifier in the bedroom.   04/29/22 Joyce Watts is a 63 y.o. female here for follow up for evaluation of joint pain in multiple areas especially with concern of increasing stiffness and intermittent swelling in the bilateral hands. She saw Dr. Kay for follow up and planning to schedule right knee replacement once approved through insurance. Joint pains in hands remains worst around the base of the thumb area. She bought some OTC eye drops for dryness. Also notices some persistent eye swelling or irritation after styling eye lashes.   Previous HPI 04/12/2022 Joyce Watts is a 63 y.o. female here for evaluation of joint pain in multiple areas especially with concern of increasing stiffness and intermittent swelling in the bilateral hands.  She has longstanding history of knee osteoarthritis has had steroid and viscosupplementation injections with Dr. Kay.  Also has some chronic joint pain and frequent popping in her shoulders and elbows and hands but this has worsened.  She had bilateral carpal tunnel release surgery last year due to progressive worsening of sensory changes and had a good relief of symptoms after these.  However in about the past 6 months she has been experiencing worsening pain and stiffness in both hands also in the right shoulder and elbow.  She notices intermittent swelling or puffiness throughout the hands often lasting up to 1 or 2 hours daily.  She tried taking meloxicam  for inflammation with no appreciable benefit although switching to twice daily naproxen she has seen partial improvement in symptoms when taking this.  She notices locking up sensation pretty frequently throughout the right arm and in fingers of both hands.  The locking up of fingers is often most severe at night or first thing in the morning.  She had some laboratory testing for this with a elevated sedimentation rate. Besides her joint pains she does feel some persistent fatigue and gets  short of breath more easily with activity than normal for her.  She has chronic dryness of eyes and mouth.  Does not take any specific medications for this.     Imaging reviewed 08/28/23 MRI Left Humerus IMPRESSION: 1. A 10 x 8 x 72 mm intramedullary bone lesion in the proximal humeral diaphysis most compatible with a benign enchondroma. 2. Complete tear of the supraspinatus and infraspinatus tendons.   07/10/23 CXR IMPRESSION: 1. No active cardiopulmonary disease. 2. Sclerotic lesion in the shaft of the left humerus, possibly representing infarct or chondroid lesion. Correlate for any symptoms of pain   Labs reviewed 06/2023 Myositis Ab panel neg  UPEP neg   02/2023 ESR 28 CRP 4.4 CK 315   04/2022 ANA 1:1280 speckled dsDNA, SSA, SSB neg CCP neg ESR 19 CRP 5.6 Complement C3/C4 wnl   Review of Systems  Constitutional:  Negative for fatigue.  HENT:  Positive for mouth dryness. Negative for mouth sores.   Eyes:  Positive for dryness.  Respiratory:  Negative for shortness of breath.   Cardiovascular:  Negative for chest pain and palpitations.  Gastrointestinal:  Positive for constipation. Negative for blood in stool and diarrhea.  Endocrine: Negative for increased urination.  Genitourinary:  Negative for involuntary urination.  Musculoskeletal:  Positive for joint pain, gait problem, joint pain, joint swelling, myalgias, morning stiffness and myalgias. Negative for muscle weakness and muscle tenderness.  Skin:  Positive for rash and sensitivity to sunlight. Negative for color change and hair loss.  Allergic/Immunologic: Negative for susceptible to infections.  Neurological:  Negative for dizziness and headaches.  Hematological:  Negative for swollen glands.  Psychiatric/Behavioral:  Positive for depressed mood. Negative for sleep disturbance. The patient is nervous/anxious.     PMFS History:  Patient Active Problem List   Diagnosis Date Noted   Angular cheilitis  02/03/2024   Extensor tendon rupture of hand, right, subsequent encounter 12/05/2023   Rheumatoid arthritis (HCC) 12/05/2023   Generalized osteoarthritis of multiple sites 09/10/2023   Elevated CK 06/05/2023   Carpal tunnel syndrome 02/25/2023   Dermatitis of face 02/25/2023   Fatigue 02/25/2023   High risk medication use 04/29/2022   Sjogren's disease (HCC) 04/29/2022   Bilateral hand pain 04/12/2022   Pain in right shoulder 04/12/2022   Elevated sed rate 04/12/2022   Other malaise and fatigue 04/21/2014   Secondary hypothyroidism 04/21/2014   Left thyroid  nodule 04/21/2014    Past Medical History:  Diagnosis Date   Anxiety    Asthma    Carpal tunnel syndrome    Depression    Hyperlipidemia    LDL in 6/15 = 133   Hypothyroidism    Dr. Von   Migraines    Past history    Family History  Problem Relation Age of Onset   Dementia Mother    High Cholesterol Mother    Hypertension Father    Diabetes Father    Bladder Cancer Father    Myasthenia gravis Father    Diabetes Maternal Grandfather    Cancer Paternal Grandmother    Diabetes Paternal Grandfather    Thyroid  disease Neg Hx    Past Surgical History:  Procedure Laterality Date   CARPAL TUNNEL RELEASE Right 01/24/2021   CARPAL TUNNEL RELEASE Left 02/12/2021   KNEE ARTHROPLASTY     REPLACEMENT TOTAL KNEE Right    SPINAL CORD DECOMPRESSION  09/05/1992   C1   TENDON TRANSFER Right 12/10/2023   Procedure: TRANSFER, TENDON;  Surgeon: Shari Easter, MD;  Location: [facility] OR;  Service: Orthopedics;  Laterality: Right;   TENOSYNOVECTOMY Right 12/10/2023   Procedure: TENOSYNOVECTOMY;  Surgeon: Shari Easter, MD;  Location: [facility] OR;  Service: Orthopedics;  Laterality: Right;   TONSILECTOMY/ADENOIDECTOMY WITH MYRINGOTOMY N/A 1993   Social History   Social History Narrative   Not on file   Immunization History  Administered Date(s) Administered   Influenza, Quadrivalent, Recombinant, Inj, Pf 06/10/2019, 06/12/2020    Influenza,inj,Quad PF,6+ Mos 06/16/2014, 05/24/2016, 08/20/2017   Influenza-Unspecified 09/19/2015, 06/10/2019, 06/19/2021   PFIZER(Purple Top)SARS-COV-2 Vaccination 11/19/2019, 12/15/2019, 08/28/2020     Objective: Vital Signs: BP 136/79 (BP Location: Left Arm, Patient Position: Sitting, Cuff Size: Normal)  Pulse 69   Resp 16   Ht 5' 2 (1.575 m)   Wt 159 lb 12.8 oz (72.5 kg)   BMI 29.23 kg/m    Physical Exam HENT:     Head:     Comments: Firm, nontender nodule in right cheek near anterior border of parotid, no surrounding induration or erythema, no abnormality visible on buccal mucosa Eyes:     Conjunctiva/sclera: Conjunctivae normal.  Cardiovascular:     Rate and Rhythm: Normal rate and regular rhythm.  Pulmonary:     Effort: Pulmonary effort is normal.     Breath sounds: Normal breath sounds.  Lymphadenopathy:     Cervical: No cervical adenopathy.  Skin:    General: Skin is warm and dry.  Neurological:     Mental Status: She is alert.  Psychiatric:        Mood and Affect: Mood normal.      Musculoskeletal Exam:  Shoulders full ROM, pain with overhead movement Elbows full ROM no tenderness or swelling Right MCPs some swelling and tenderness, active extension ROM reduced in 4th/5th fingers but passive ROM is preserved Left hand MCP and PIP joint tenderness without palpable synovitis Knees full ROM no tenderness or swelling Ankles full ROM no tenderness or swelling   Investigation: No additional findings.  Imaging: No results found.  Recent Labs: Lab Results  Component Value Date   WBC 8.1 12/05/2023   HGB 13.3 04/22/2024   PLT 308 12/05/2023   NA 143 12/05/2023   K 3.9 12/05/2023   CL 105 12/05/2023   CO2 30 12/05/2023   GLUCOSE 72 12/05/2023   BUN 17 12/05/2023   CREATININE 0.75 12/05/2023   BILITOT 0.4 12/05/2023   AST 20 12/05/2023   ALT 10 12/05/2023   PROT 6.5 12/05/2023   CALCIUM 9.4 12/05/2023    Speciality Comments: No specialty  comments available.  Procedures:  No procedures performed Allergies: Bacitracin, Neomycin , Bacitracin-polymyxin b, and Sulfa antibiotics   Assessment / Plan:     Visit Diagnoses: Sjogren's syndrome with other organ involvement (HCC)  Rheumatoid arthritis involving right hand, unspecified whether rheumatoid factor present (HCC)  Sjogren's syndrome with previous poor tolerance to methotrexate . Hydroxychloroquine  considered for treatment due to favorable safety profile. Not recommending biologic therpay at this time with still localized disease and no RA serology evidence of disease activity. Also with plans for upcoming shoulder surgery limit immunosuppression treatment for now. - Checking ESr, CRP, and complements C3/C4 for disease activity assessment - Checking G6PD for risk assessment on HCQ treatment given multiple drug intolerances - Plan to start HCQ 200 mg daily if labs okay  Extensor tendon rupture of hand, right, subsequent encounter Extensor tendon injury of hand with impaired finger extension Chronic extensor tendon injury with impaired finger extension. Persistent inability to lift two fingers affects occupational capabilities. Full recovery uncertain.  Cyst and nodule of right hand and index finger Nodule and cyst in right hand and index finger, worsening over years. Cyst on flexor side of index finger.  Jaw pain with intermittent limitation of mouth opening Intermittent jaw pain with limited mouth opening, possibly related to cystic change in parotid gland. No red flags for abscess at this time. Could be sialadenitis or cystic change 2/2 sjogrens. Dental evaluation indicated dead tooth nerve requiring implant.  Rotator cuff tear Scheduled for rotator cuff repair surgery on May 31, 2024, by Dr. Kay. Procedure involves arthroscopy and tendon repair.       Orders: No orders of the defined  types were placed in this encounter.  No orders of the defined types were  placed in this encounter.    Follow-Up Instructions: Return in about 3 months (around 07/21/2024) for pSS/RA ?HCQ start f/u 3mos.   Lonni LELON Ester, MD  Note - This record has been created using [employer].  Chart creation errors have been sought, but may not always  have been located. Such creation errors do not reflect on  the standard of medical care.

## 2024-04-20 ENCOUNTER — Telehealth: Payer: Self-pay

## 2024-04-20 ENCOUNTER — Ambulatory Visit: Attending: Internal Medicine | Admitting: Internal Medicine

## 2024-04-20 ENCOUNTER — Encounter: Payer: Self-pay | Admitting: Internal Medicine

## 2024-04-20 ENCOUNTER — Other Ambulatory Visit (HOSPITAL_COMMUNITY): Payer: Self-pay

## 2024-04-20 VITALS — BP 136/79 | HR 69 | Resp 16 | Ht 62.0 in | Wt 159.8 lb

## 2024-04-20 DIAGNOSIS — M069 Rheumatoid arthritis, unspecified: Secondary | ICD-10-CM | POA: Diagnosis not present

## 2024-04-20 DIAGNOSIS — M3509 Sicca syndrome with other organ involvement: Secondary | ICD-10-CM | POA: Diagnosis not present

## 2024-04-20 DIAGNOSIS — Z79899 Other long term (current) drug therapy: Secondary | ICD-10-CM

## 2024-04-20 DIAGNOSIS — S66811D Strain of other specified muscles, fascia and tendons at wrist and hand level, right hand, subsequent encounter: Secondary | ICD-10-CM

## 2024-04-20 NOTE — Telephone Encounter (Signed)
 Contacted patient to see if she could come in for labs , patient stated she would be in tomorrow.

## 2024-04-20 NOTE — Patient Instructions (Signed)
 I would consider hydroxychloroquine as an alternative medication for inflammatory arthritis that carries less risk for infections than methotrexate . I am rechecking your inflammatory markers today also checking a test for an enzyme that metabolizes the medication to make sure you have no increased risk for side effects. I would wait for resolution of the current shingles before starting new medication as well.

## 2024-04-21 ENCOUNTER — Telehealth: Payer: Self-pay | Admitting: Internal Medicine

## 2024-04-21 DIAGNOSIS — M069 Rheumatoid arthritis, unspecified: Secondary | ICD-10-CM

## 2024-04-21 DIAGNOSIS — M3509 Sicca syndrome with other organ involvement: Secondary | ICD-10-CM

## 2024-04-21 DIAGNOSIS — Z79899 Other long term (current) drug therapy: Secondary | ICD-10-CM

## 2024-04-21 NOTE — Telephone Encounter (Signed)
 Lab orders released.

## 2024-04-21 NOTE — Telephone Encounter (Signed)
 Pt called stating it would be tomorrow before they would be able to see her

## 2024-04-21 NOTE — Telephone Encounter (Signed)
 Patient contacted the office requesting lab orders to be be release to [facility] [place].   Patient plans to have labs on 04/21/24 TODAY.

## 2024-04-24 LAB — SEDIMENTATION RATE: Sed Rate: 34 mm/h (ref 0–40)

## 2024-04-24 LAB — GLUCOSE 6 PHOSPHATE DEHYDROGENASE
G-6-PD, Quant: 10.5 U/g{Hb} (ref 5.5–14.2)
Hemoglobin: 13.3 g/dL (ref 11.1–15.9)

## 2024-04-24 LAB — C3 AND C4
Complement C3, Serum: 165 mg/dL (ref 82–167)
Complement C4, Serum: 35 mg/dL (ref 12–38)

## 2024-04-24 LAB — C-REACTIVE PROTEIN: CRP: 3 mg/L (ref 0–10)

## 2024-04-29 ENCOUNTER — Encounter: Payer: Self-pay | Admitting: Internal Medicine

## 2024-04-30 ENCOUNTER — Other Ambulatory Visit: Payer: Self-pay

## 2024-04-30 ENCOUNTER — Other Ambulatory Visit (HOSPITAL_COMMUNITY): Payer: Self-pay

## 2024-04-30 ENCOUNTER — Ambulatory Visit: Payer: Self-pay | Admitting: Internal Medicine

## 2024-04-30 MED ORDER — HYDROXYCHLOROQUINE SULFATE 200 MG PO TABS
200.0000 mg | ORAL_TABLET | Freq: Every day | ORAL | 0 refills | Status: DC
  Filled 2024-04-30: qty 30, 30d supply, fill #0
  Filled 2024-05-11 – 2024-05-26 (×2): qty 30, 30d supply, fill #1
  Filled 2024-06-08: qty 30, 30d supply, fill #2

## 2024-04-30 NOTE — Addendum Note (Signed)
 Addended by: JEANNETTA LONNI ORN on: 04/30/2024 10:13 AM   Modules accepted: Orders

## 2024-04-30 NOTE — Progress Notes (Signed)
 Sed rate CRP and complements were normal.  The G6PD enzyme function is normal so should not be any problem for starting the hydroxychloroquine  medicine we discussed.

## 2024-04-30 NOTE — Telephone Encounter (Signed)
 Now addressed in associated result note

## 2024-05-09 ENCOUNTER — Other Ambulatory Visit (HOSPITAL_COMMUNITY): Payer: Self-pay

## 2024-05-10 ENCOUNTER — Other Ambulatory Visit: Payer: Self-pay

## 2024-05-11 ENCOUNTER — Other Ambulatory Visit: Payer: Self-pay

## 2024-05-11 ENCOUNTER — Other Ambulatory Visit (HOSPITAL_COMMUNITY): Payer: Self-pay

## 2024-05-12 ENCOUNTER — Ambulatory Visit: Admitting: Internal Medicine

## 2024-05-27 ENCOUNTER — Other Ambulatory Visit (HOSPITAL_COMMUNITY): Payer: Self-pay

## 2024-05-27 ENCOUNTER — Other Ambulatory Visit: Payer: Self-pay

## 2024-06-08 ENCOUNTER — Other Ambulatory Visit (HOSPITAL_COMMUNITY): Payer: Self-pay

## 2024-06-08 DIAGNOSIS — H01131 Eczematous dermatitis of right upper eyelid: Secondary | ICD-10-CM | POA: Diagnosis not present

## 2024-06-08 DIAGNOSIS — Z79899 Other long term (current) drug therapy: Secondary | ICD-10-CM | POA: Diagnosis not present

## 2024-06-08 DIAGNOSIS — H43812 Vitreous degeneration, left eye: Secondary | ICD-10-CM | POA: Diagnosis not present

## 2024-06-08 DIAGNOSIS — H52223 Regular astigmatism, bilateral: Secondary | ICD-10-CM | POA: Diagnosis not present

## 2024-06-08 DIAGNOSIS — H01134 Eczematous dermatitis of left upper eyelid: Secondary | ICD-10-CM | POA: Diagnosis not present

## 2024-06-08 DIAGNOSIS — Q0700 Arnold-Chiari syndrome without spina bifida or hydrocephalus: Secondary | ICD-10-CM | POA: Diagnosis not present

## 2024-06-08 DIAGNOSIS — H04123 Dry eye syndrome of bilateral lacrimal glands: Secondary | ICD-10-CM | POA: Diagnosis not present

## 2024-06-08 DIAGNOSIS — H2513 Age-related nuclear cataract, bilateral: Secondary | ICD-10-CM | POA: Diagnosis not present

## 2024-06-08 DIAGNOSIS — H524 Presbyopia: Secondary | ICD-10-CM | POA: Diagnosis not present

## 2024-06-24 DIAGNOSIS — Z4789 Encounter for other orthopedic aftercare: Secondary | ICD-10-CM | POA: Diagnosis not present

## 2024-06-24 DIAGNOSIS — M79641 Pain in right hand: Secondary | ICD-10-CM | POA: Diagnosis not present

## 2024-06-24 DIAGNOSIS — M25512 Pain in left shoulder: Secondary | ICD-10-CM | POA: Diagnosis not present

## 2024-06-24 DIAGNOSIS — M65322 Trigger finger, left index finger: Secondary | ICD-10-CM | POA: Diagnosis not present

## 2024-07-06 ENCOUNTER — Other Ambulatory Visit: Payer: Self-pay | Admitting: Internal Medicine

## 2024-07-06 ENCOUNTER — Other Ambulatory Visit (HOSPITAL_COMMUNITY): Payer: Self-pay

## 2024-07-06 DIAGNOSIS — M069 Rheumatoid arthritis, unspecified: Secondary | ICD-10-CM

## 2024-07-06 DIAGNOSIS — Z79899 Other long term (current) drug therapy: Secondary | ICD-10-CM

## 2024-07-06 DIAGNOSIS — M3509 Sicca syndrome with other organ involvement: Secondary | ICD-10-CM

## 2024-07-07 ENCOUNTER — Other Ambulatory Visit (HOSPITAL_BASED_OUTPATIENT_CLINIC_OR_DEPARTMENT_OTHER): Payer: Self-pay

## 2024-07-07 ENCOUNTER — Other Ambulatory Visit: Payer: Self-pay

## 2024-07-07 DIAGNOSIS — M75122 Complete rotator cuff tear or rupture of left shoulder, not specified as traumatic: Secondary | ICD-10-CM | POA: Diagnosis not present

## 2024-07-07 DIAGNOSIS — M25512 Pain in left shoulder: Secondary | ICD-10-CM | POA: Diagnosis not present

## 2024-07-07 MED ORDER — HYDROXYCHLOROQUINE SULFATE 200 MG PO TABS
200.0000 mg | ORAL_TABLET | Freq: Every day | ORAL | 0 refills | Status: AC
  Filled 2024-07-07: qty 90, 90d supply, fill #0

## 2024-07-07 NOTE — Telephone Encounter (Signed)
 Last Fill: 04/30/2024  Eye exam: none on file yet, recently started   Labs: 12/05/2023 MCH 26.8 MCHC 30.5 Rest of CBC and CMP WNL  Next Visit: 07/26/2024  Last Visit: 04/20/2024  DX: Sjogren's syndrome with other organ involvement   Current Dose per office note 04/20/2024: Plan to start HCQ 200 mg daily   Okay to refill Plaquenil ?  Patient to update CBC and CMP and upcoming appointment.

## 2024-07-08 ENCOUNTER — Other Ambulatory Visit: Payer: Self-pay

## 2024-07-08 ENCOUNTER — Other Ambulatory Visit (HOSPITAL_COMMUNITY): Payer: Self-pay

## 2024-07-08 MED ORDER — GABAPENTIN 100 MG PO CAPS
100.0000 mg | ORAL_CAPSULE | Freq: Every day | ORAL | 3 refills | Status: AC
  Filled 2024-07-08: qty 30, 10d supply, fill #0

## 2024-07-12 NOTE — Progress Notes (Signed)
 Office Visit Note  Patient: Joyce Watts             Date of Birth: 10-02-1960           MRN: [id number]             PCP: Arloa Elsie SAUNDERS, MD Referring: Arloa Elsie SAUNDERS, MD Visit Date: 07/26/2024   Subjective:  No chief complaint on file.     Discussed the use of AI scribe software for clinical note transcription with the patient, who gave verbal consent to proceed.  History of Present Illness   Joyce Watts is a 63 y.o. female here for follow up  with rheumatoid arthritis on HCQ 200 mg daily and currently a medrol  dosepak who presents with joint pain and hand dysfunction.  She has been experiencing significant issues with her hands, particularly due to tendon problems. Recently, she received a steroid injection for severe morning stiffness in her hand, which until then improved daily with heat application but returning overnight and morning. She has not been taking the 5 mg prednisone  recently. She noticed no problems starting HCQ. Her baseline eye exam was normal. She describes significant swelling in her hands, which improved after a course of steroids, allowing her to wear rings again. Her fingers are bending better than before, although some swelling and pain persist.  She is concerned about the potential for further tendon ruptures, as she has experienced in the past, and is wary of the long-term use of steroids due to her grandmother's experience with them. She is currently unemployed and on her [employer], which is better than her previous coverage. She takes calcium supplements and vitamin D with K2 as advised by her gynecologist. She has been researching dietary changes to help with RA and has been incorporating more vegetables into her diet.  Her surgeon Dr. Kay, after reviewing a recent MRI, advised against a rotator cuff repair due to the poor condition of her tendons. Despite the MRI findings, she retains good arm function.       Previous  HPI 04/20/2024 Joyce Watts is a 63 year old female with rheumatoid arthritis who presents with joint pain and hand dysfunction.   She has ongoing issues with her hands and wrists, despite previous surgical intervention. After surgery, she is unable to fully extend her 4th and 5th impacting her ability to work which requires a  lot of typing. Occasional tenderness is present, and a new nodule has been noticed in the area. Flexion strength and range is normal.   She has a history of methotrexate  use, which worsened her condition.    A lump in her right cheek is associated with difficulty opening her mouth wide, affecting her ability to eat normally. A recent dental visit revealed a dead root without decay, leading to a need for a dental implant. The pain is intermittent, with no current pain but significant discomfort when it occurs.   She is scheduled for rotator cuff repair surgery on May 31, 2024, due to a split tendon.        Previous HPI 02/03/24 Joyce Watts is a 63 year old female with rheumatoid arthritis who presents for post-surgical follow-up for tendon rupture and synovitis.   Approximately eight weeks ago, she underwent surgery for a ruptured tendon in her hand. Initially planned for one finger, the surgery was extended to two fingers due to a rupture in the ring finger. She continues to experience swelling and limited use  of her fingers, significantly impacting her daily activities.   She has a history of rheumatoid arthritis, contributing to tendon ruptures and swelling in her fingers. Multiple fingers have been affected by tendon ruptures. She also reports swelling in her wrist, and synovitis was removed during her operation, described as fibrous upon testing.   Post-surgery, she experiences pain primarily in the wrist where the synovitis was removed. She uses prednisone  intermittently to manage inflammation and pain, taking one to two tablets daily depending on symptom  severity. Her grandmother also had long-term use of prednisone .   She is concerned about potential hair loss with methotrexate  treatment, having experienced significant hair thinning following previous medical treatments, including knee surgery. She notes very little hair growth on her legs, which she attributes to her current condition or treatment.   Her job involves significant use of her hands, which she acknowledges could contribute to her symptoms, although she notes that her level of tendon rupture is unusual compared to others with similar hand usage.         Previous HPI 12/05/2023 Joyce Watts is a 63 year old female with Sjogren's syndrome who presents with hand swelling and tendon issues. She was referred by her primary care physician to a surgeon after an x-ray showed tendon damage.   She has been experiencing swelling in her entire hand for about a month and a half, starting in February. The swelling is accompanied by significant pain, especially at the end of the day. She noticed a knot on the back of her hand, which led her to visit her primary care physician. An x-ray revealed tendon damage and a blackened area on the end of her ring finger. Despite attempts to manage the swelling and pain with home remedies, these symptoms have persisted. She has been using tape to keep her finger straight, which she finds somewhat helpful.   She has undergone surgery on two places and is scheduled for another surgery to swap a tendon from one finger to the little finger to allow it to straighten, and to clean up inflammation in the wrist. The swelling and pain have persisted despite these interventions.   She has a history of Sjogren's syndrome and has been experiencing symptoms that are typically seen in rheumatoid arthritis, such as joint inflammation and tendon issues. She has not been on any medication for Sjogren's syndrome. Her lab tests have shown high antibody titers, but other  inflammation markers have been normal.   She has a rash around her eyes that clears up with medication from her eye doctor. She associates the rash with stress and allergies. She has experienced significant life stressors recently, including losing her job in January.   She discovered two torn tendons in her left shoulder, which have been painful when lifting her arm, especially when carrying weight.      Previous HPI 06/05/23 Joyce Watts is a 63 y.o. female here for follow up for Sjogren's syndrome and osteoarthritis.  At her last visit we did a right wrist injection for severely increased pain and stiffness symptoms did improve although there was some waning benefit she feels is now down to about her baseline level of joint pain and stiffness.  Not having any persistent numbness.  She still has stiffness usually worst in the shoulders and the hips first thing in the morning also has pain at night that requires her to shift positions usually at least 2 or 3 times per night awakening.  Continues using  pilocarpine  for dry mouth also Act dry mouth products are helpful. She keeps frequent bruising on her legs most commonly at the lower anterior shin on her right leg also has a much larger area of bruising since returning from a recent trip last month.   02/25/2023 Joyce Watts is a 63 y.o. female here for follow up for Sjogren's syndrome and osteoarthritis. Increased pain and stiffness. Worse since weeks ago, has gradually improved part ways. She resumed taking naproxen nightly for this. She also feels weakness or loss of precision with her hands. Pain and stiffness often worse at night and early morning. Also feels sore in the forearm muscles near elbow. This does not feel similar to previous carpal tunnel symptoms.   12/04/2022 Joyce Watts is a 63 y.o. female here for follow up for Sjogren's syndrome and osteoarthritis.  At her last visit we did right shoulder steroid injection she felt a good  improvement in symptoms and has stayed better since that time.  Getting a little bit of pain with some full overhead movement but much better than before.  Still has dry mouth symptoms she is taking the pilocarpine  at nighttime which is helpful.  No new ulcers or lesions no thrush.  She recently took course of Augmentin  for suspected bacterial sinus infection though is still having drainage problems again afterwards.  Thinks this may be more allergy related she does have history of environmental allergies.  Also with facial rashes that are itchy sometimes flaky around her eyes and face she is treating this with cetaphil and taking Allegra for allergies.  She is also noticed this rash can be more pronounced when stressed out.  She is very stressed and getting sleep deprivation when looking after her mother who is seriously ill and end-of-life.  Had a few more episodes with bluish finger discoloration but no associated lesions or skin peeling.  Taking Tylenol  as needed for hand osteoarthritis not every day.   09/04/22 Joyce Watts is a 63 y.o. female here for follow up for sjogren syndrome and osteoarthritis. She had right knee replacement surgery in November this was uneventful and has been progressing well with rehab so far. Flexion ROM is still improving. She is no longer taking any NSAIDs regularly, takes tylenol  arthritis as needed which is partially helpful. Her left thumb still hurts sometimes but worst area is the right shoulder. Still having dryness symptoms uses drops occasionally, her mouth is a bigger problem sometimes with burning discomfort and tried adding a humidifier in the bedroom.   04/29/22 Joyce Watts is a 63 y.o. female here for follow up for evaluation of joint pain in multiple areas especially with concern of increasing stiffness and intermittent swelling in the bilateral hands. She saw Dr. Kay for follow up and planning to schedule right knee replacement once approved through  insurance. Joint pains in hands remains worst around the base of the thumb area. She bought some OTC eye drops for dryness. Also notices some persistent eye swelling or irritation after styling eye lashes.   Previous HPI 04/12/2022 Joyce Watts is a 63 y.o. female here for evaluation of joint pain in multiple areas especially with concern of increasing stiffness and intermittent swelling in the bilateral hands.  She has longstanding history of knee osteoarthritis has had steroid and viscosupplementation injections with Dr. Kay.  Also has some chronic joint pain and frequent popping in her shoulders and elbows and hands but this has worsened.  She had bilateral  carpal tunnel release surgery last year due to progressive worsening of sensory changes and had a good relief of symptoms after these.  However in about the past 6 months she has been experiencing worsening pain and stiffness in both hands also in the right shoulder and elbow.  She notices intermittent swelling or puffiness throughout the hands often lasting up to 1 or 2 hours daily.  She tried taking meloxicam  for inflammation with no appreciable benefit although switching to twice daily naproxen she has seen partial improvement in symptoms when taking this.  She notices locking up sensation pretty frequently throughout the right arm and in fingers of both hands.  The locking up of fingers is often most severe at night or first thing in the morning.  She had some laboratory testing for this with a elevated sedimentation rate. Besides her joint pains she does feel some persistent fatigue and gets short of breath more easily with activity than normal for her.  She has chronic dryness of eyes and mouth.  Does not take any specific medications for this.     Imaging reviewed 08/28/23 MRI Left Humerus IMPRESSION: 1. A 10 x 8 x 72 mm intramedullary bone lesion in the proximal humeral diaphysis most compatible with a benign enchondroma. 2. Complete  tear of the supraspinatus and infraspinatus tendons.   07/10/23 CXR IMPRESSION: 1. No active cardiopulmonary disease. 2. Sclerotic lesion in the shaft of the left humerus, possibly representing infarct or chondroid lesion. Correlate for any symptoms of pain   Labs reviewed 06/2023 Myositis Ab panel neg UPEP neg   02/2023 ESR 28 CRP 4.4 CK 315   04/2022 ANA 1:1280 speckled dsDNA, SSA, SSB neg CCP neg ESR 19 CRP 5.6 Complement C3/C4 wnl   Review of Systems  Constitutional:  Negative for fatigue.  HENT:  Positive for mouth dryness. Negative for mouth sores.   Eyes:  Positive for dryness.  Respiratory:  Negative for shortness of breath.   Cardiovascular:  Negative for chest pain and palpitations.  Gastrointestinal:  Positive for constipation. Negative for blood in stool and diarrhea.  Endocrine: Negative for increased urination.  Genitourinary:  Negative for involuntary urination.  Musculoskeletal:  Positive for joint pain, gait problem, joint pain, joint swelling and morning stiffness. Negative for myalgias, muscle weakness, muscle tenderness and myalgias.  Skin:  Positive for hair loss and sensitivity to sunlight. Negative for color change and rash.  Allergic/Immunologic: Negative for susceptible to infections.  Neurological:  Positive for dizziness and headaches.  Hematological:  Negative for swollen glands.  Psychiatric/Behavioral:  Negative for depressed mood and sleep disturbance. The patient is not nervous/anxious.     PMFS History:  Patient Active Problem List   Diagnosis Date Noted   Allergic rhinitis, unspecified 07/26/2024   Anxiety 07/26/2024   Attention deficit hyperactivity disorder, predominantly inattentive type 07/26/2024   Hypercholesterolemia 07/26/2024   Migraine without aura and responsive to treatment 07/26/2024   Mild intermittent asthma 07/26/2024   Oropharyngeal dysphagia 07/26/2024   Bone density screening not performed 07/26/2024   Angular  cheilitis 02/03/2024   Extensor tendon rupture of hand, right, subsequent encounter 12/05/2023   Rheumatoid arthritis (HCC) 12/05/2023   Full thickness rotator cuff tear 10/30/2023   Generalized osteoarthritis of multiple sites 09/10/2023   Elevated CK 06/05/2023   Dermatitis of face 02/25/2023   Fatigue 02/25/2023   High risk medication use 04/29/2022   Sjogren's disease 04/29/2022   Bilateral hand pain 04/12/2022   Pain in right shoulder 04/12/2022  Elevated sed rate 04/12/2022   Arthritis of both knees 01/08/2018   Other malaise and fatigue 04/21/2014   Secondary hypothyroidism 04/21/2014   Left thyroid  nodule 04/21/2014    Past Medical History:  Diagnosis Date   Anxiety    Asthma    Carpal tunnel syndrome    Depression    Hyperlipidemia    LDL in 6/15 = 133   Hypothyroidism    Dr. Von   Migraines    Past history    Family History  Problem Relation Age of Onset   Dementia Mother    High Cholesterol Mother    Hypertension Father    Diabetes Father    Bladder Cancer Father    Myasthenia gravis Father    Diabetes Maternal Grandfather    Cancer Paternal Grandmother    Diabetes Paternal Grandfather    Thyroid  disease Neg Hx    Past Surgical History:  Procedure Laterality Date   CARPAL TUNNEL RELEASE Right 01/24/2021   CARPAL TUNNEL RELEASE Left 02/12/2021   KNEE ARTHROPLASTY     REPLACEMENT TOTAL KNEE Right    SPINAL CORD DECOMPRESSION  09/05/1992   C1   TENDON TRANSFER Right 12/10/2023   Procedure: TRANSFER, TENDON;  Surgeon: Shari Easter, MD;  Location: [facility] OR;  Service: Orthopedics;  Laterality: Right;   TENOSYNOVECTOMY Right 12/10/2023   Procedure: TENOSYNOVECTOMY;  Surgeon: Shari Easter, MD;  Location: [facility] OR;  Service: Orthopedics;  Laterality: Right;   TONSILECTOMY/ADENOIDECTOMY WITH MYRINGOTOMY N/A 1993   Social History   Social History Narrative   Not on file   Immunization History  Administered Date(s) Administered   Fluzone Influenza virus  vaccine,trivalent (IIV3), split virus 08/20/2017   Influenza, Quadrivalent, Recombinant, Inj, Pf 06/10/2019, 06/12/2020, 06/19/2021   Influenza,inj,Quad PF,6+ Mos 06/16/2014, 05/24/2016, 08/20/2017, 07/13/2018, 06/21/2022   Influenza,trivalent, recombinat, inj, PF 07/10/2023   Influenza-Unspecified 09/19/2015, 06/10/2019, 06/19/2021   Moderna Sars-Covid-2 Vaccination 08/02/2021, 08/24/2021   PFIZER(Purple Top)SARS-COV-2 Vaccination 11/19/2019, 12/15/2019, 08/28/2020   Pfizer(Comirnaty)Fall Seasonal Vaccine 12 years and older 08/27/2020     Objective: Vital Signs: BP (!) 150/78 (BP Location: Right Arm, Patient Position: Sitting, Cuff Size: Normal)   Pulse 62   Temp 98.6 F (37 C)   Resp 15   Ht 5' 2 (1.575 m)   Wt 161 lb (73 kg)   BMI 29.45 kg/m    Physical Exam Eyes:     Conjunctiva/sclera: Conjunctivae normal.  Cardiovascular:     Rate and Rhythm: Normal rate and regular rhythm.  Pulmonary:     Effort: Pulmonary effort is normal.     Breath sounds: Normal breath sounds.  Musculoskeletal:     Right lower leg: No edema.     Left lower leg: No edema.  Lymphadenopathy:     Cervical: No cervical adenopathy.  Skin:    General: Skin is warm and dry.     Findings: No rash.  Neurological:     Mental Status: She is alert.  Psychiatric:        Mood and Affect: Mood normal.      Musculoskeletal Exam:  Shoulders full ROM, pain with overhead movement on right but passive ROM preserved Elbows full ROM no tenderness or swelling Right hand active extension ROM reduced in 4th/5th fingers but passive ROM is preserved Left hand MCP and PIP joint tenderness, palpable swelling left 2nd-3rd PIPs Ganglion cysts on flexor side multiple fingers on left hand, right hand digial cysts on dorsum of right 2nd-3rd fingers Knees full ROM no tenderness or  swelling Ankles full ROM no tenderness or swelling  Investigation: No additional findings.  Imaging: No results found.  Recent  Labs: Lab Results  Component Value Date   WBC 8.1 12/05/2023   HGB 13.3 04/22/2024   PLT 308 12/05/2023   NA 143 12/05/2023   K 3.9 12/05/2023   CL 105 12/05/2023   CO2 30 12/05/2023   GLUCOSE 72 12/05/2023   BUN 17 12/05/2023   CREATININE 0.75 12/05/2023   BILITOT 0.4 12/05/2023   AST 20 12/05/2023   ALT 10 12/05/2023   PROT 6.5 12/05/2023   CALCIUM 9.4 12/05/2023    Speciality Comments: No specialty comments available.  Procedures:  No procedures performed Allergies: Sulfamethoxazole-trimethoprim, Bacitracin, Neomycin , Bacitracin-polymyxin b, and Sulfa antibiotics   Assessment / Plan:     Visit Diagnoses: Rheumatoid arthritis involving right hand, unspecified whether rheumatoid factor present (HCC) - Plan: predniSONE  (DELTASONE ) 5 MG tablet Severe tendon damage noted on MRI, previous right 4th-5t tendon rupture. Current hydroxychloroquine  treatment. Recent steroid burst improved symptoms. Potential tendon rupture risk due to tenosynovitis. Discussed biologic therapy escalation, but financial constraints noted and low activity right now but on steroids. Low-dose prednisone  considered for symptom management. Report of mildly tender nodules on forearm extensor surfaces coming and going possibly rheumatoid nodules. - Prescribed 5 mg prednisone  tablets as needed for symptoms. - Continue hydroxychloroquine  200 mg daily. - Monitor prednisone  use to assess need for RA treatment escalation.  Sjogren's syndrome with other organ involvement Chronic dryness taking pilocarpine  daily. No new sialadenitis episode or swelling on exam. - Continue pilocarpine  5 mg BID PRN  Extensor tendon rupture of hand, right, subsequent encounter Following with Dr. Shari. No additional procedure but was put back on steroid taper for active left hand inflammation. Has chronic right wrist pain 2/2 OA and subluxation and limited active finger extension but stable.  Complete tear of right rotator cuff,  unspecified whether traumatic Observation for now, fllowing with D.r Kay. Previous plan for surgery delayed due to pretty good function at present and potential poor wound healing with extensive connective tissue involvements.  Bone density screening not performed Risk for osteoporosis due to rheumatoid arthritis and steroid use Increased osteoporosis risk due to RA, steroid use, and postmenopausal status. No prior bone density scan. Discussed low-dose prednisone  impact on bone density. Emphasized calcium and vitamin D supplementation. - Ordered bone density scan. - Continue calcium and vitamin D supplementation.        Orders: No orders of the defined types were placed in this encounter.  Meds ordered this encounter  Medications   predniSONE  (DELTASONE ) 5 MG tablet    Sig: Take 1 tablet (5 mg total) by mouth daily as needed.    Dispense:  90 tablet    Refill:  0     Follow-Up Instructions: Return in about 3 months (around 10/26/2024) for RA/SS on HCQ/GC f/u 3mos.   Lonni LELON Ester, MD  Note - This record has been created using [employer].  Chart creation errors have been sought, but may not always  have been located. Such creation errors do not reflect on  the standard of medical care.

## 2024-07-19 DIAGNOSIS — H04123 Dry eye syndrome of bilateral lacrimal glands: Secondary | ICD-10-CM | POA: Diagnosis not present

## 2024-07-19 DIAGNOSIS — Z79899 Other long term (current) drug therapy: Secondary | ICD-10-CM | POA: Diagnosis not present

## 2024-07-23 ENCOUNTER — Other Ambulatory Visit (HOSPITAL_COMMUNITY): Payer: Self-pay

## 2024-07-23 DIAGNOSIS — M79641 Pain in right hand: Secondary | ICD-10-CM | POA: Diagnosis not present

## 2024-07-23 DIAGNOSIS — Z4789 Encounter for other orthopedic aftercare: Secondary | ICD-10-CM | POA: Diagnosis not present

## 2024-07-23 DIAGNOSIS — M65322 Trigger finger, left index finger: Secondary | ICD-10-CM | POA: Diagnosis not present

## 2024-07-23 MED ORDER — METHYLPREDNISOLONE 4 MG PO TBPK
ORAL_TABLET | ORAL | 0 refills | Status: AC
Start: 2024-07-23 — End: ?
  Filled 2024-07-23: qty 21, 6d supply, fill #0

## 2024-07-26 ENCOUNTER — Encounter: Payer: Self-pay | Admitting: Internal Medicine

## 2024-07-26 ENCOUNTER — Other Ambulatory Visit (HOSPITAL_COMMUNITY): Payer: Self-pay

## 2024-07-26 ENCOUNTER — Other Ambulatory Visit: Payer: Self-pay

## 2024-07-26 ENCOUNTER — Ambulatory Visit: Attending: Internal Medicine | Admitting: Internal Medicine

## 2024-07-26 VITALS — BP 150/78 | HR 62 | Temp 98.6°F | Resp 15 | Ht 62.0 in | Wt 161.0 lb

## 2024-07-26 DIAGNOSIS — F900 Attention-deficit hyperactivity disorder, predominantly inattentive type: Secondary | ICD-10-CM | POA: Insufficient documentation

## 2024-07-26 DIAGNOSIS — Z539 Procedure and treatment not carried out, unspecified reason: Secondary | ICD-10-CM | POA: Insufficient documentation

## 2024-07-26 DIAGNOSIS — Z79899 Other long term (current) drug therapy: Secondary | ICD-10-CM

## 2024-07-26 DIAGNOSIS — M79642 Pain in left hand: Secondary | ICD-10-CM

## 2024-07-26 DIAGNOSIS — M79641 Pain in right hand: Secondary | ICD-10-CM | POA: Diagnosis not present

## 2024-07-26 DIAGNOSIS — R1312 Dysphagia, oropharyngeal phase: Secondary | ICD-10-CM | POA: Insufficient documentation

## 2024-07-26 DIAGNOSIS — M3509 Sicca syndrome with other organ involvement: Secondary | ICD-10-CM

## 2024-07-26 DIAGNOSIS — S66811D Strain of other specified muscles, fascia and tendons at wrist and hand level, right hand, subsequent encounter: Secondary | ICD-10-CM | POA: Diagnosis not present

## 2024-07-26 DIAGNOSIS — M75121 Complete rotator cuff tear or rupture of right shoulder, not specified as traumatic: Secondary | ICD-10-CM | POA: Diagnosis not present

## 2024-07-26 DIAGNOSIS — E7800 Pure hypercholesterolemia, unspecified: Secondary | ICD-10-CM | POA: Insufficient documentation

## 2024-07-26 DIAGNOSIS — M069 Rheumatoid arthritis, unspecified: Secondary | ICD-10-CM

## 2024-07-26 DIAGNOSIS — G43009 Migraine without aura, not intractable, without status migrainosus: Secondary | ICD-10-CM | POA: Insufficient documentation

## 2024-07-26 DIAGNOSIS — F419 Anxiety disorder, unspecified: Secondary | ICD-10-CM | POA: Insufficient documentation

## 2024-07-26 DIAGNOSIS — J4520 Mild intermittent asthma, uncomplicated: Secondary | ICD-10-CM | POA: Insufficient documentation

## 2024-07-26 DIAGNOSIS — J309 Allergic rhinitis, unspecified: Secondary | ICD-10-CM | POA: Insufficient documentation

## 2024-07-26 MED ORDER — PREDNISONE 5 MG PO TABS
5.0000 mg | ORAL_TABLET | Freq: Every day | ORAL | 0 refills | Status: AC | PRN
  Filled 2024-07-26: qty 90, 90d supply, fill #0

## 2024-07-28 ENCOUNTER — Other Ambulatory Visit: Payer: Self-pay

## 2024-07-28 ENCOUNTER — Other Ambulatory Visit (HOSPITAL_COMMUNITY): Payer: Self-pay

## 2024-07-28 DIAGNOSIS — M059 Rheumatoid arthritis with rheumatoid factor, unspecified: Secondary | ICD-10-CM | POA: Diagnosis not present

## 2024-07-28 DIAGNOSIS — R600 Localized edema: Secondary | ICD-10-CM | POA: Diagnosis not present

## 2024-07-28 DIAGNOSIS — B0229 Other postherpetic nervous system involvement: Secondary | ICD-10-CM | POA: Diagnosis not present

## 2024-07-28 DIAGNOSIS — E7800 Pure hypercholesterolemia, unspecified: Secondary | ICD-10-CM | POA: Diagnosis not present

## 2024-07-28 DIAGNOSIS — J309 Allergic rhinitis, unspecified: Secondary | ICD-10-CM | POA: Diagnosis not present

## 2024-07-28 DIAGNOSIS — M3501 Sicca syndrome with keratoconjunctivitis: Secondary | ICD-10-CM | POA: Diagnosis not present

## 2024-07-28 DIAGNOSIS — F419 Anxiety disorder, unspecified: Secondary | ICD-10-CM | POA: Diagnosis not present

## 2024-07-28 DIAGNOSIS — G43909 Migraine, unspecified, not intractable, without status migrainosus: Secondary | ICD-10-CM | POA: Diagnosis not present

## 2024-07-28 DIAGNOSIS — E039 Hypothyroidism, unspecified: Secondary | ICD-10-CM | POA: Diagnosis not present

## 2024-07-28 DIAGNOSIS — F900 Attention-deficit hyperactivity disorder, predominantly inattentive type: Secondary | ICD-10-CM | POA: Diagnosis not present

## 2024-07-28 MED ORDER — BUPROPION HCL ER (XL) 300 MG PO TB24
300.0000 mg | ORAL_TABLET | Freq: Every day | ORAL | 3 refills | Status: AC
  Filled 2024-07-28: qty 90, 90d supply, fill #0

## 2024-08-02 ENCOUNTER — Encounter: Payer: Self-pay | Admitting: Internal Medicine

## 2024-08-04 ENCOUNTER — Other Ambulatory Visit (HOSPITAL_COMMUNITY): Payer: Self-pay

## 2024-08-04 ENCOUNTER — Other Ambulatory Visit: Payer: Self-pay

## 2024-08-04 ENCOUNTER — Other Ambulatory Visit (HOSPITAL_BASED_OUTPATIENT_CLINIC_OR_DEPARTMENT_OTHER): Payer: Self-pay

## 2024-08-06 ENCOUNTER — Other Ambulatory Visit: Payer: Self-pay

## 2024-08-06 ENCOUNTER — Other Ambulatory Visit (HOSPITAL_COMMUNITY): Payer: Self-pay

## 2024-08-06 MED ORDER — MONTELUKAST SODIUM 10 MG PO TABS
ORAL_TABLET | ORAL | 5 refills | Status: AC
  Filled 2024-08-06: qty 30, 30d supply, fill #0
  Filled 2024-09-04: qty 90, 90d supply, fill #1

## 2024-08-10 DIAGNOSIS — M65322 Trigger finger, left index finger: Secondary | ICD-10-CM | POA: Diagnosis not present

## 2024-08-10 DIAGNOSIS — M79641 Pain in right hand: Secondary | ICD-10-CM | POA: Diagnosis not present

## 2024-08-10 DIAGNOSIS — Z4789 Encounter for other orthopedic aftercare: Secondary | ICD-10-CM | POA: Diagnosis not present

## 2024-08-10 DIAGNOSIS — M79644 Pain in right finger(s): Secondary | ICD-10-CM | POA: Diagnosis not present

## 2024-08-12 ENCOUNTER — Encounter (HOSPITAL_COMMUNITY): Payer: Self-pay

## 2024-08-12 ENCOUNTER — Other Ambulatory Visit: Payer: Self-pay | Admitting: Internal Medicine

## 2024-08-12 DIAGNOSIS — M3509 Sicca syndrome with other organ involvement: Secondary | ICD-10-CM

## 2024-08-12 DIAGNOSIS — M069 Rheumatoid arthritis, unspecified: Secondary | ICD-10-CM

## 2024-08-16 ENCOUNTER — Other Ambulatory Visit (HOSPITAL_COMMUNITY): Payer: Self-pay

## 2024-09-04 ENCOUNTER — Other Ambulatory Visit (HOSPITAL_COMMUNITY): Payer: Self-pay

## 2024-09-06 ENCOUNTER — Other Ambulatory Visit (HOSPITAL_COMMUNITY): Payer: Self-pay

## 2024-09-06 ENCOUNTER — Other Ambulatory Visit: Payer: Self-pay

## 2024-09-06 MED ORDER — HYDROCHLOROTHIAZIDE 25 MG PO TABS
25.0000 mg | ORAL_TABLET | Freq: Every morning | ORAL | 0 refills | Status: AC
  Filled 2024-09-06: qty 90, 90d supply, fill #0

## 2024-09-13 ENCOUNTER — Other Ambulatory Visit (HOSPITAL_COMMUNITY): Payer: Self-pay

## 2024-09-13 MED ORDER — ALPRAZOLAM 0.5 MG PO TABS
0.5000 mg | ORAL_TABLET | Freq: Three times a day (TID) | ORAL | 0 refills | Status: AC | PRN
  Filled 2024-09-13: qty 40, 14d supply, fill #0

## 2024-09-14 ENCOUNTER — Other Ambulatory Visit: Payer: Self-pay

## 2024-10-06 ENCOUNTER — Other Ambulatory Visit: Payer: Self-pay

## 2024-10-07 ENCOUNTER — Other Ambulatory Visit: Payer: Self-pay

## 2024-10-12 ENCOUNTER — Other Ambulatory Visit

## 2024-10-25 ENCOUNTER — Other Ambulatory Visit: Payer: 59

## 2024-10-26 ENCOUNTER — Ambulatory Visit: Admitting: Internal Medicine

## 2024-10-29 ENCOUNTER — Ambulatory Visit: Payer: 59 | Admitting: Endocrinology
